# Patient Record
Sex: Female | Born: 1975
Health system: Southern US, Community
[De-identification: ages and names within clinical notes are randomized; demographics above are authoritative.]

## PROBLEM LIST (undated history)

## (undated) DIAGNOSIS — A609 Anogenital herpesviral infection, unspecified: Secondary | ICD-10-CM

## (undated) DIAGNOSIS — F419 Anxiety disorder, unspecified: Secondary | ICD-10-CM

## (undated) DIAGNOSIS — J45909 Unspecified asthma, uncomplicated: Secondary | ICD-10-CM

## (undated) HISTORY — PX: WISDOM TOOTH EXTRACTION: SHX21

## (undated) HISTORY — DX: Anxiety disorder, unspecified: F41.9

## (undated) HISTORY — DX: Unspecified asthma, uncomplicated: J45.909

## (undated) HISTORY — DX: Anogenital herpesviral infection, unspecified: A60.9

---

## 1999-07-20 ENCOUNTER — Other Ambulatory Visit: Admission: RE | Admit: 1999-07-20 | Discharge: 1999-07-20 | Payer: Self-pay | Admitting: Obstetrics and Gynecology

## 2000-09-17 ENCOUNTER — Other Ambulatory Visit: Admission: RE | Admit: 2000-09-17 | Discharge: 2000-09-17 | Payer: Self-pay | Admitting: Obstetrics and Gynecology

## 2001-12-31 ENCOUNTER — Other Ambulatory Visit: Admission: RE | Admit: 2001-12-31 | Discharge: 2001-12-31 | Payer: Self-pay | Admitting: Obstetrics and Gynecology

## 2003-01-20 ENCOUNTER — Other Ambulatory Visit: Admission: RE | Admit: 2003-01-20 | Discharge: 2003-01-20 | Payer: Self-pay | Admitting: Obstetrics and Gynecology

## 2004-02-14 ENCOUNTER — Other Ambulatory Visit: Admission: RE | Admit: 2004-02-14 | Discharge: 2004-02-14 | Payer: Self-pay | Admitting: Obstetrics and Gynecology

## 2005-03-19 ENCOUNTER — Other Ambulatory Visit: Admission: RE | Admit: 2005-03-19 | Discharge: 2005-03-19 | Payer: Self-pay | Admitting: Obstetrics and Gynecology

## 2005-10-08 ENCOUNTER — Inpatient Hospital Stay (HOSPITAL_COMMUNITY): Admission: AD | Admit: 2005-10-08 | Discharge: 2005-10-10 | Payer: Self-pay | Admitting: Obstetrics and Gynecology

## 2006-03-26 ENCOUNTER — Other Ambulatory Visit: Admission: RE | Admit: 2006-03-26 | Discharge: 2006-03-26 | Payer: Self-pay | Admitting: Obstetrics and Gynecology

## 2006-07-28 ENCOUNTER — Emergency Department (HOSPITAL_COMMUNITY): Admission: EM | Admit: 2006-07-28 | Discharge: 2006-07-28 | Payer: Self-pay | Admitting: Emergency Medicine

## 2012-03-01 LAB — HM PAP SMEAR: HM PAP: NORMAL

## 2012-05-14 LAB — OB RESULTS CONSOLE ABO/RH: RH Type: POSITIVE

## 2012-05-14 LAB — OB RESULTS CONSOLE RUBELLA ANTIBODY, IGM: Rubella: IMMUNE

## 2012-05-14 LAB — OB RESULTS CONSOLE RPR: RPR: NONREACTIVE

## 2012-05-14 LAB — OB RESULTS CONSOLE HEPATITIS B SURFACE ANTIGEN: Hepatitis B Surface Ag: NEGATIVE

## 2012-05-14 LAB — OB RESULTS CONSOLE HIV ANTIBODY (ROUTINE TESTING): HIV: NONREACTIVE

## 2012-10-29 NOTE — L&D Delivery Note (Signed)
Delivery Note Pt progressed quickly to complete and pushed well.  At 10:26 PM a viable female was delivered via Vaginal, Spontaneous Delivery (Presentation: Left Occiput Anterior).  APGAR: 8, 9; weight pending.   Placenta status: Intact, Spontaneous.  Cord: 3 vessels with the following complications: None.   Anesthesia: Local  Episiotomy: Median, done due to minimal anesthesia and severe pain due to rapid progress Lacerations:  None  Suture Repair: 3.0 vicryl rapide Est. Blood Loss (mL): 400  Mom to postpartum.  Baby to stay with mom.  Shiane Wenberg D 12/01/2012, 11:21 PM

## 2012-11-26 ENCOUNTER — Telehealth (HOSPITAL_COMMUNITY): Payer: Self-pay | Admitting: *Deleted

## 2012-11-26 ENCOUNTER — Encounter (HOSPITAL_COMMUNITY): Payer: Self-pay | Admitting: *Deleted

## 2012-11-26 NOTE — Telephone Encounter (Signed)
Preadmission screen  

## 2012-12-01 ENCOUNTER — Encounter (HOSPITAL_COMMUNITY): Payer: Self-pay | Admitting: *Deleted

## 2012-12-01 ENCOUNTER — Inpatient Hospital Stay (HOSPITAL_COMMUNITY)
Admission: AD | Admit: 2012-12-01 | Discharge: 2012-12-03 | DRG: 372 | Disposition: A | Discharge status: Home / Self Care | Payer: BC Managed Care – PPO | Source: Ambulatory Visit | Attending: Obstetrics and Gynecology | Admitting: Obstetrics and Gynecology

## 2012-12-01 ENCOUNTER — Encounter (HOSPITAL_COMMUNITY): Payer: Self-pay | Admitting: Anesthesiology

## 2012-12-01 ENCOUNTER — Inpatient Hospital Stay (HOSPITAL_COMMUNITY): Payer: BC Managed Care – PPO | Admitting: Anesthesiology

## 2012-12-01 DIAGNOSIS — IMO0001 Reserved for inherently not codable concepts without codable children: Secondary | ICD-10-CM

## 2012-12-01 DIAGNOSIS — A6000 Herpesviral infection of urogenital system, unspecified: Secondary | ICD-10-CM | POA: Diagnosis present

## 2012-12-01 DIAGNOSIS — O98519 Other viral diseases complicating pregnancy, unspecified trimester: Principal | ICD-10-CM | POA: Diagnosis present

## 2012-12-01 LAB — CBC
Hemoglobin: 12.3 g/dL (ref 12.0–15.0)
Platelets: 194 10*3/uL (ref 150–400)
RBC: 3.93 MIL/uL (ref 3.87–5.11)

## 2012-12-01 MED ORDER — FENTANYL 2.5 MCG/ML BUPIVACAINE 1/10 % EPIDURAL INFUSION (WH - ANES)
14.0000 mL/h | INTRAMUSCULAR | Status: DC
  Filled 2012-12-01: qty 125

## 2012-12-01 MED ORDER — ONDANSETRON HCL 4 MG/2ML IJ SOLN: 4.0000 mg | Freq: Four times a day (QID) | INTRAMUSCULAR | Status: DC | PRN

## 2012-12-01 MED ORDER — BUTORPHANOL TARTRATE 1 MG/ML IJ SOLN
1.0000 mg | Freq: Once | INTRAMUSCULAR | Status: AC
  Administered 2012-12-01: 1 mg via INTRAVENOUS
  Filled 2012-12-01: qty 1

## 2012-12-01 MED ORDER — CITRIC ACID-SODIUM CITRATE 334-500 MG/5ML PO SOLN: 30.0000 mL | ORAL | Status: DC | PRN

## 2012-12-01 MED ORDER — PHENYLEPHRINE 40 MCG/ML (10ML) SYRINGE FOR IV PUSH (FOR BLOOD PRESSURE SUPPORT)
80.0000 ug | PREFILLED_SYRINGE | INTRAVENOUS | Status: DC | PRN
  Filled 2012-12-01: qty 5

## 2012-12-01 MED ORDER — OXYTOCIN 40 UNITS IN LACTATED RINGERS INFUSION - SIMPLE MED
62.5000 mL/h | INTRAVENOUS | Status: DC
  Administered 2012-12-01: 62.5 mL/h via INTRAVENOUS
  Filled 2012-12-01: qty 1000

## 2012-12-01 MED ORDER — LACTATED RINGERS IV SOLN
INTRAVENOUS | Status: DC
  Administered 2012-12-01: 23:00:00 via INTRAVENOUS

## 2012-12-01 MED ORDER — IBUPROFEN 600 MG PO TABS
600.0000 mg | ORAL_TABLET | Freq: Four times a day (QID) | ORAL | Status: DC | PRN
  Administered 2012-12-01: 600 mg via ORAL
  Filled 2012-12-01: qty 1

## 2012-12-01 MED ORDER — LACTATED RINGERS IV SOLN: 500.0000 mL | INTRAVENOUS | Status: DC | PRN

## 2012-12-01 MED ORDER — ACETAMINOPHEN 325 MG PO TABS: 650.0000 mg | ORAL_TABLET | ORAL | Status: DC | PRN

## 2012-12-01 MED ORDER — NALOXONE HCL 0.4 MG/ML IJ SOLN
INTRAMUSCULAR | Status: AC
  Filled 2012-12-01: qty 1

## 2012-12-01 MED ORDER — EPHEDRINE 5 MG/ML INJ
10.0000 mg | INTRAVENOUS | Status: DC | PRN
  Filled 2012-12-01: qty 4

## 2012-12-01 MED ORDER — DIPHENHYDRAMINE HCL 50 MG/ML IJ SOLN: 12.5000 mg | INTRAMUSCULAR | Status: DC | PRN

## 2012-12-01 MED ORDER — LACTATED RINGERS IV SOLN: 500.0000 mL | Freq: Once | INTRAVENOUS | Status: DC

## 2012-12-01 MED ORDER — EPHEDRINE 5 MG/ML INJ: 10.0000 mg | INTRAVENOUS | Status: DC | PRN

## 2012-12-01 MED ORDER — PHENYLEPHRINE 40 MCG/ML (10ML) SYRINGE FOR IV PUSH (FOR BLOOD PRESSURE SUPPORT): 80.0000 ug | PREFILLED_SYRINGE | INTRAVENOUS | Status: DC | PRN

## 2012-12-01 MED ORDER — OXYCODONE-ACETAMINOPHEN 5-325 MG PO TABS: 1.0000 | ORAL_TABLET | ORAL | Status: DC | PRN

## 2012-12-01 MED ORDER — LIDOCAINE HCL (PF) 1 % IJ SOLN
30.0000 mL | INTRAMUSCULAR | Status: DC | PRN
  Administered 2012-12-01: 30 mL via SUBCUTANEOUS
  Filled 2012-12-01: qty 30

## 2012-12-01 MED ORDER — OXYTOCIN BOLUS FROM INFUSION
500.0000 mL | INTRAVENOUS | Status: DC
  Administered 2012-12-01: 500 mL via INTRAVENOUS

## 2012-12-01 NOTE — Treatment Plan (Signed)
Report to Wilkie Aye , Forensic scientist.  Patient may go to 71

## 2012-12-01 NOTE — MAU Note (Signed)
Seen in office today.  Contractions got stronger around 7 pm.  No leaking any fluid

## 2012-12-01 NOTE — H&P (Signed)
Cassandra Gross is a 37 y.o. female, G3 P1011, EGA 38+ weeks with EDC 2-14 presenting for evaluation of ctx.  In MAU, reg ctx, VE 3 cm dilated.  While waiting to see if she changed, ctx became more painful.  She was admitted, had SROM and rapidly progressed to complete.  Prenatal care complicated by h/o HSV, on Valtrex suppression for the past few weeks without symptoms or lesions.  See prenatal records for complete history.  Maternal Medical History:  Reason for admission: Reason for admission: contractions.  Contractions: Frequency: regular.   Perceived severity is strong.    Fetal activity: Perceived fetal activity is normal.    Prenatal complications: no prenatal complications   OB History    Grav Para Term Preterm Abortions TAB SAB Ect Mult Living   2 1 1  1  1   1     SVD at 40 weeks, 7 lbs 2 oz, vacuum assist SAB  Past Medical History  Diagnosis Date  . Asthma   . HSV (herpes simplex virus) anogenital infection    Past Surgical History  Procedure Date  . Wisdom tooth extraction    Family History: family history includes Asthma in her maternal grandfather and mother; Hyperlipidemia in her father; and Hypertension in her father. Social History:  reports that she has never smoked. She has never used smokeless tobacco. She reports that she does not drink alcohol or use illicit drugs.   Prenatal Transfer Tool  Maternal Diabetes: No Genetic Screening: Declined Maternal Ultrasounds/Referrals: Normal Fetal Ultrasounds or other Referrals:  None Maternal Substance Abuse:  No Significant Maternal Medications:  Meds include: Other: Valtrex Significant Maternal Lab Results:  Lab values include: Group B Strep negative Other Comments:  h/o HSV  Review of Systems  Respiratory: Negative.   Cardiovascular: Negative.     Dilation: 10 Effacement (%): 100 Station: +2 Exam by:: [email or web address]-[email or web address], rnc Blood pressure 114/61, pulse 98, temperature 97.3 F (36.3 C), temperature source  Oral, resp. rate 22, height 5\' 3"  (1.6 m), weight 69.4 kg (153 lb), last menstrual period 03/07/2012, unknown if currently breastfeeding. Maternal Exam:  Uterine Assessment: Contraction strength is firm.  Contraction frequency is regular.   Abdomen: Patient reports no abdominal tenderness. Estimated fetal weight is 7 lbs.   Fetal presentation: vertex  Introitus: Normal vulva. Normal vagina.  Pelvis: adequate for delivery.   Cervix: Cervix evaluated by digital exam.     Physical Exam  Constitutional: She appears well-developed and well-nourished.  Cardiovascular: Normal rate and normal heart sounds.   No murmur heard. Respiratory: Effort normal. No respiratory distress. She has no wheezes.  GI: Soft.       gravid    Prenatal labs: ABO, Rh: O/Positive/-- (07/17 0000) Antibody: Negative (07/17 0000) Rubella: Immune, Nonimmune (07/17 0000) RPR: Nonreactive (07/17 0000)  HBsAg: Negative (07/17 0000)  HIV: Non-reactive (07/17 0000)  GBS: Negative (01/22 0000)  GCT:  159 GTT nl  Assessment/Plan: IUP at 38+ weeks in active labor, see delivery note.   Deaken Jurgens D 12/01/2012, 11:15 PM

## 2012-12-01 NOTE — Treatment Plan (Signed)
Call to Dr Jackelyn Knife. Pt may be admitted to Labor and delivery

## 2012-12-01 NOTE — Anesthesia Preprocedure Evaluation (Deleted)
Anesthesia Evaluation  Patient identified by MRN, date of birth, ID band Patient awake    Reviewed: Allergy & Precautions, H&P , Patient's Chart, lab work & pertinent test results  Airway Mallampati: II TM Distance: >3 FB Neck ROM: full    Dental No notable dental hx.    Pulmonary neg pulmonary ROS, asthma ,  breath sounds clear to auscultation  Pulmonary exam normal       Cardiovascular negative cardio ROS  Rhythm:regular Rate:Normal     Neuro/Psych negative neurological ROS  negative psych ROS   GI/Hepatic negative GI ROS, Neg liver ROS,   Endo/Other  negative endocrine ROS  Renal/GU negative Renal ROS     Musculoskeletal   Abdominal   Peds  Hematology negative hematology ROS (+)   Anesthesia Other Findings Asthma     HSV (herpes simplex virus) anogenital infection   Reproductive/Obstetrics (+) Pregnancy                           Anesthesia Physical Anesthesia Plan  ASA: II  Anesthesia Plan: Epidural   Post-op Pain Management:    Induction:   Airway Management Planned:   Additional Equipment:   Intra-op Plan:   Post-operative Plan:   Informed Consent: I have reviewed the patients History and Physical, chart, labs and discussed the procedure including the risks, benefits and alternatives for the proposed anesthesia with the patient or authorized representative who has indicated his/her understanding and acceptance.     Plan Discussed with:   Anesthesia Plan Comments:         Anesthesia Quick Evaluation

## 2012-12-02 ENCOUNTER — Encounter (HOSPITAL_COMMUNITY): Payer: Self-pay | Admitting: *Deleted

## 2012-12-02 LAB — TYPE AND SCREEN

## 2012-12-02 LAB — RPR: RPR Ser Ql: NONREACTIVE

## 2012-12-02 MED ORDER — MEASLES, MUMPS & RUBELLA VAC ~~LOC~~ INJ
0.5000 mL | INJECTION | Freq: Once | SUBCUTANEOUS | Status: DC
  Filled 2012-12-02: qty 0.5

## 2012-12-02 MED ORDER — WITCH HAZEL-GLYCERIN EX PADS: 1.0000 "application " | MEDICATED_PAD | CUTANEOUS | Status: DC | PRN

## 2012-12-02 MED ORDER — ZOLPIDEM TARTRATE 5 MG PO TABS
5.0000 mg | ORAL_TABLET | Freq: Every evening | ORAL | Status: DC | PRN
Start: 2012-12-02 — End: 2012-12-03

## 2012-12-02 MED ORDER — SENNOSIDES-DOCUSATE SODIUM 8.6-50 MG PO TABS
2.0000 | ORAL_TABLET | Freq: Every day | ORAL | Status: DC
  Administered 2012-12-02: 2 via ORAL

## 2012-12-02 MED ORDER — DIBUCAINE 1 % RE OINT: 1.0000 "application " | TOPICAL_OINTMENT | RECTAL | Status: DC | PRN

## 2012-12-02 MED ORDER — SIMETHICONE 80 MG PO CHEW: 80.0000 mg | CHEWABLE_TABLET | ORAL | Status: DC | PRN

## 2012-12-02 MED ORDER — METHYLERGONOVINE MALEATE 0.2 MG/ML IJ SOLN: 0.2000 mg | INTRAMUSCULAR | Status: DC | PRN

## 2012-12-02 MED ORDER — ONDANSETRON HCL 4 MG/2ML IJ SOLN: 4.0000 mg | INTRAMUSCULAR | Status: DC | PRN

## 2012-12-02 MED ORDER — TETANUS-DIPHTH-ACELL PERTUSSIS 5-2.5-18.5 LF-MCG/0.5 IM SUSP: 0.5000 mL | Freq: Once | INTRAMUSCULAR | Status: DC

## 2012-12-02 MED ORDER — IBUPROFEN 600 MG PO TABS
600.0000 mg | ORAL_TABLET | Freq: Four times a day (QID) | ORAL | Status: DC
  Administered 2012-12-02 – 2012-12-03 (×5): 600 mg via ORAL
  Filled 2012-12-02 (×5): qty 1

## 2012-12-02 MED ORDER — METHYLERGONOVINE MALEATE 0.2 MG PO TABS: 0.2000 mg | ORAL_TABLET | ORAL | Status: DC | PRN

## 2012-12-02 MED ORDER — DIPHENHYDRAMINE HCL 25 MG PO CAPS: 25.0000 mg | ORAL_CAPSULE | Freq: Four times a day (QID) | ORAL | Status: DC | PRN

## 2012-12-02 MED ORDER — ONDANSETRON HCL 4 MG PO TABS: 4.0000 mg | ORAL_TABLET | ORAL | Status: DC | PRN

## 2012-12-02 MED ORDER — BENZOCAINE-MENTHOL 20-0.5 % EX AERO
1.0000 "application " | INHALATION_SPRAY | CUTANEOUS | Status: DC | PRN
  Administered 2012-12-02: 1 via TOPICAL
  Filled 2012-12-02: qty 56

## 2012-12-02 MED ORDER — OXYCODONE-ACETAMINOPHEN 5-325 MG PO TABS: 1.0000 | ORAL_TABLET | ORAL | Status: DC | PRN

## 2012-12-02 MED ORDER — MAGNESIUM HYDROXIDE 400 MG/5ML PO SUSP: 30.0000 mL | ORAL | Status: DC | PRN

## 2012-12-02 MED ORDER — PRENATAL MULTIVITAMIN CH
1.0000 | ORAL_TABLET | Freq: Every day | ORAL | Status: DC
  Administered 2012-12-02: 1 via ORAL
  Filled 2012-12-02: qty 1

## 2012-12-02 MED ORDER — LANOLIN HYDROUS EX OINT: TOPICAL_OINTMENT | CUTANEOUS | Status: DC | PRN

## 2012-12-02 NOTE — Progress Notes (Signed)
PPD #1 No problems Afeb, VSS Fundus firm, NT at U-1 Continue routine postpartum care 

## 2012-12-03 NOTE — Discharge Summary (Signed)
Obstetric Discharge Summary Reason for Admission: onset of labor Prenatal Procedures: none Intrapartum Procedures: spontaneous vaginal delivery Postpartum Procedures: none Complications-Operative and Postpartum: 2nd degree perineal laceration Hemoglobin  Date Value Range Status  12/01/2012 12.3  12.0 - 15.0 g/dL Final     HCT  Date Value Range Status  12/01/2012 36.5  36.0 - 46.0 % Final    Physical Exam:  General: alert Lochia: appropriate Uterine Fundus: firm  Discharge Diagnoses: Term Pregnancy-delivered  Discharge Information: Date: 12/03/2012 Activity: pelvic rest Diet: routine Medications: Ibuprofen Condition: stable Instructions: refer to practice specific booklet Discharge to: home Follow-up Information    Follow up with Maidie Streight D, MD. Schedule an appointment as soon as possible for a visit in 6 weeks.   Contact information:   [address] [place] [address] [phone]          Newborn Data: Live born female  Birth Weight: 6 lb 5.8 oz (2886 g) APGAR: 8, 9  Home with mother.  Garlon Tuggle D 12/03/2012, 10:13 AM

## 2012-12-03 NOTE — Progress Notes (Signed)
PPD #2 No problems Afeb, VSS D/c home 

## 2012-12-05 ENCOUNTER — Inpatient Hospital Stay (HOSPITAL_COMMUNITY): Admission: RE | Admit: 2012-12-05 | Payer: BC Managed Care – PPO | Source: Ambulatory Visit

## 2014-02-26 ENCOUNTER — Telehealth: Payer: Self-pay

## 2014-02-26 NOTE — Telephone Encounter (Signed)
Left message for call back Non-identifiable   Pap Flu- 08/13/12 Td- 09/10/12

## 2014-03-01 ENCOUNTER — Encounter: Payer: Self-pay | Admitting: Family Medicine

## 2014-03-01 ENCOUNTER — Ambulatory Visit (INDEPENDENT_AMBULATORY_CARE_PROVIDER_SITE_OTHER): Payer: BC Managed Care – PPO | Admitting: Family Medicine

## 2014-03-01 VITALS — BP 102/74 | HR 81 | Temp 98.5°F | Resp 16 | Ht 63.0 in | Wt 139.5 lb

## 2014-03-01 DIAGNOSIS — J45909 Unspecified asthma, uncomplicated: Secondary | ICD-10-CM

## 2014-03-01 DIAGNOSIS — Z0000 Encounter for general adult medical examination without abnormal findings: Secondary | ICD-10-CM

## 2014-03-01 DIAGNOSIS — J4530 Mild persistent asthma, uncomplicated: Secondary | ICD-10-CM | POA: Insufficient documentation

## 2014-03-01 NOTE — Progress Notes (Signed)
Pre visit review using our clinic review tool, if applicable. No additional management support is needed unless otherwise documented below in the visit note. 

## 2014-03-01 NOTE — Patient Instructions (Signed)
Follow up in 1 year or as needed We'll notify you of your lab results and make any changes if needed Keep up the good work!  You look great! Welcome!  We're glad to have you!!! 

## 2014-03-01 NOTE — Progress Notes (Signed)
   Subjective:    Patient ID: Cassandra Gross, female    DOB: 06-07-1976, 38 y.o.   MRN: [id number]  HPI New to establish.  No previous PCP.  UTD on pap (Dr Meissinger).  Asthma- chronic problem, will take Symbicort in the Spring and Fall when allergies flare.  Otherwise will use albuterol as needed.   Review of Systems Patient reports no vision/ hearing changes, adenopathy,fever, weight change,  persistant/recurrent hoarseness , swallowing issues, chest pain, palpitations, edema, persistant/recurrent cough, hemoptysis, dyspnea (rest/exertional/paroxysmal nocturnal), gastrointestinal bleeding (melena, rectal bleeding), abdominal pain, significant heartburn, bowel changes, GU symptoms (dysuria, hematuria, incontinence), Gyn symptoms (abnormal  bleeding, pain),  syncope, focal weakness, memory loss, numbness & tingling, skin/hair/nail changes, abnormal bruising or bleeding, anxiety, or depression.     Objective:   Physical Exam General Appearance:    Alert, cooperative, no distress, appears stated age  Head:    Normocephalic, without obvious abnormality, atraumatic  Eyes:    PERRL, conjunctiva/corneas clear, EOM's intact, fundi    benign, both eyes  Ears:    Normal TM's and external ear canals, both ears  Nose:   Nares normal, septum midline, mucosa normal, no drainage    or sinus tenderness  Throat:   Lips, mucosa, and tongue normal; teeth and gums normal  Neck:   Supple, symmetrical, trachea midline, no adenopathy;    Thyroid: no enlargement/tenderness/nodules  Back:     Symmetric, no curvature, ROM normal, no CVA tenderness  Lungs:     Clear to auscultation bilaterally, respirations unlabored  Chest Wall:    No tenderness or deformity   Heart:    Regular rate and rhythm, S1 and S2 normal, no murmur, rub   or gallop  Breast Exam:    Deferred to GYN  Abdomen:     Soft, non-tender, bowel sounds active all four quadrants,    no masses, no organomegaly  Genitalia:    Deferred to GYN    Rectal:    Extremities:   Extremities normal, atraumatic, no cyanosis or edema  Pulses:   2+ and symmetric all extremities  Skin:   Skin color, texture, turgor normal, no rashes or lesions  Lymph nodes:   Cervical, supraclavicular, and axillary nodes normal  Neurologic:   CNII-XII intact, normal strength, sensation and reflexes    throughout          Assessment & Plan:

## 2014-03-01 NOTE — Assessment & Plan Note (Signed)
Pt's PE WNL.  UTD on GYN.  Check labs.  Anticipatory guidance provided.  

## 2014-03-01 NOTE — Assessment & Plan Note (Signed)
New to provider, well controlled per pt report.  Will refill meds prn.

## 2014-03-02 ENCOUNTER — Encounter: Payer: Self-pay | Admitting: General Practice

## 2014-03-02 LAB — CBC WITH DIFFERENTIAL/PLATELET
BASOS ABS: 0 10*3/uL (ref 0.0–0.1)
Basophils Relative: 0.4 % (ref 0.0–3.0)
EOS ABS: 0.4 10*3/uL (ref 0.0–0.7)
Eosinophils Relative: 6.1 % — ABNORMAL HIGH (ref 0.0–5.0)
HCT: 38.8 % (ref 36.0–46.0)
Hemoglobin: 13 g/dL (ref 12.0–15.0)
LYMPHS PCT: 28 % (ref 12.0–46.0)
Lymphs Abs: 1.9 10*3/uL (ref 0.7–4.0)
MCHC: 33.4 g/dL (ref 30.0–36.0)
MCV: 95.9 fl (ref 78.0–100.0)
Monocytes Absolute: 0.6 10*3/uL (ref 0.1–1.0)
Monocytes Relative: 8.6 % (ref 3.0–12.0)
NEUTROS ABS: 3.8 10*3/uL (ref 1.4–7.7)
NEUTROS PCT: 56.9 % (ref 43.0–77.0)
Platelets: 248 10*3/uL (ref 150.0–400.0)
RBC: 4.04 Mil/uL (ref 3.87–5.11)
RDW: 13.8 % (ref 11.5–15.5)
WBC: 6.7 10*3/uL (ref 4.0–10.5)

## 2014-03-02 LAB — BASIC METABOLIC PANEL
BUN: 11 mg/dL (ref 6–23)
CO2: 25 meq/L (ref 19–32)
Calcium: 8.9 mg/dL (ref 8.4–10.5)
Chloride: 105 mEq/L (ref 96–112)
Creatinine, Ser: 0.6 mg/dL (ref 0.4–1.2)
GFR: 112.01 mL/min (ref >60.00)
GLUCOSE: 86 mg/dL (ref 70–99)
POTASSIUM: 3.4 meq/L — AB (ref 3.5–5.1)
SODIUM: 139 meq/L (ref 135–145)

## 2014-03-02 LAB — LIPID PANEL
Cholesterol: 157 mg/dL (ref 0–200)
HDL: 55.4 mg/dL (ref >39.00)
LDL Cholesterol: 89 mg/dL (ref 0–99)
Total CHOL/HDL Ratio: 3
Triglycerides: 64 mg/dL (ref 0.0–149.0)
VLDL: 12.8 mg/dL (ref 0.0–40.0)

## 2014-03-02 LAB — HEPATIC FUNCTION PANEL
ALK PHOS: 60 U/L (ref 39–117)
ALT: 21 U/L (ref 0–35)
AST: 23 U/L (ref 0–37)
Albumin: 4.2 g/dL (ref 3.5–5.2)
BILIRUBIN DIRECT: 0 mg/dL (ref 0.0–0.3)
TOTAL PROTEIN: 6.9 g/dL (ref 6.0–8.3)
Total Bilirubin: 0.1 mg/dL — ABNORMAL LOW (ref 0.2–1.2)

## 2014-03-02 LAB — TSH: TSH: 1.31 u[IU]/mL (ref 0.35–4.50)

## 2014-03-07 LAB — VITAMIN D 1,25 DIHYDROXY
VITAMIN D3 1, 25 (OH): 64 pg/mL
Vitamin D 1, 25 (OH)2 Total: 64 pg/mL (ref 18–72)

## 2014-03-08 ENCOUNTER — Encounter: Payer: Self-pay | Admitting: General Practice

## 2014-03-08 NOTE — Telephone Encounter (Signed)
Unable to reach patient pre visit.  

## 2014-08-12 ENCOUNTER — Other Ambulatory Visit: Payer: Self-pay | Admitting: General Practice

## 2014-08-12 ENCOUNTER — Encounter: Payer: Self-pay | Admitting: Family Medicine

## 2014-08-12 ENCOUNTER — Ambulatory Visit (INDEPENDENT_AMBULATORY_CARE_PROVIDER_SITE_OTHER): Payer: BC Managed Care – PPO | Admitting: Family Medicine

## 2014-08-12 VITALS — BP 106/70 | HR 86 | Temp 98.3°F | Resp 16 | Wt 133.5 lb

## 2014-08-12 DIAGNOSIS — F419 Anxiety disorder, unspecified: Principal | ICD-10-CM

## 2014-08-12 DIAGNOSIS — F418 Other specified anxiety disorders: Secondary | ICD-10-CM

## 2014-08-12 DIAGNOSIS — F32A Depression, unspecified: Secondary | ICD-10-CM

## 2014-08-12 DIAGNOSIS — F329 Major depressive disorder, single episode, unspecified: Secondary | ICD-10-CM

## 2014-08-12 DIAGNOSIS — Z23 Encounter for immunization: Secondary | ICD-10-CM

## 2014-08-12 MED ORDER — FLUOXETINE HCL 20 MG PO TABS
20.0000 mg | ORAL_TABLET | Freq: Every day | ORAL | Status: DC
Start: 2014-08-12 — End: 2014-08-12

## 2014-08-12 MED ORDER — ALPRAZOLAM 0.5 MG PO TABS: 0.5000 mg | ORAL_TABLET | Freq: Every evening | ORAL | Status: DC | PRN

## 2014-08-12 MED ORDER — FLUOXETINE HCL 20 MG PO TABS: 20.0000 mg | ORAL_TABLET | Freq: Every day | ORAL | Status: DC

## 2014-08-12 NOTE — Assessment & Plan Note (Signed)
New.  Pt's sxs and intermittent somatic complaints are consistent w/ depression/anxiety.  Start daily controller medication w/ xanax as needed for breakthrough anxiety.  Reviewed supportive care and red flags that should prompt return.  Pt expressed understanding and is in agreement w/ plan.

## 2014-08-12 NOTE — Progress Notes (Signed)
   Subjective:    Patient ID: Cassandra Gross, female    DOB: [birth year], 38 y.o.   MRN: [id number]  HPI Anxiety- pt reports increased stress over the last months.  'multiple events that have been emotionally difficult'.  Eye twitching x3 days.  Difficulty sleeping.  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  Inability to take deep breaths at times.  Some chest pressure.  sxs are intermittent- worse w/ stress.  Increased tearfulness.  Increased irritability.  No family hx of depression/anxiety.   Review of Systems For ROS see HPI     Objective:   Physical Exam  Vitals reviewed. Constitutional: She is oriented to person, place, and time. She appears well-developed and well-nourished. No distress.  HENT:  Head: Normocephalic and atraumatic.  Neck: Normal range of motion. Neck supple. No thyromegaly present.  Cardiovascular: Normal rate, regular rhythm, normal heart sounds and intact distal pulses.   Pulmonary/Chest: Effort normal and breath sounds normal. No respiratory distress. She has no wheezes. She has no rales.  Lymphadenopathy:    She has no cervical adenopathy.  Neurological: She is alert and oriented to person, place, and time.  Skin: Skin is warm and dry.  Psychiatric: Her behavior is normal. Thought content normal.  tearful          Assessment & Plan:

## 2014-08-12 NOTE — Patient Instructions (Signed)
Follow up in 4-6 weeks to recheck anxiety Start the Fluoxetine daily for anxiety Use the Alprazolam only as needed for those panicked moments Try and find a stress outlet Call with any questions or concerns Hang in there!!!

## 2014-08-12 NOTE — Progress Notes (Signed)
Pre visit review using our clinic review tool, if applicable. No additional management support is needed unless otherwise documented below in the visit note. 

## 2014-08-30 ENCOUNTER — Encounter: Payer: Self-pay | Admitting: Family Medicine

## 2014-09-07 ENCOUNTER — Telehealth: Payer: Self-pay

## 2014-09-07 NOTE — Telephone Encounter (Signed)
Pt would like her prozac [facility]anax [facility] [address]

## 2014-09-08 NOTE — Telephone Encounter (Signed)
Called pt and advised [facility] to an [facility] being available at the [facility].

## 2014-09-22 ENCOUNTER — Encounter: Payer: Self-pay | Admitting: Family Medicine

## 2014-09-22 ENCOUNTER — Ambulatory Visit (INDEPENDENT_AMBULATORY_CARE_PROVIDER_SITE_OTHER): Payer: BC Managed Care – PPO | Admitting: Family Medicine

## 2014-09-22 VITALS — BP 110/80 | HR 92 | Temp 98.3°F | Resp 16 | Wt 134.5 lb

## 2014-09-22 DIAGNOSIS — F32A Depression, unspecified: Secondary | ICD-10-CM

## 2014-09-22 DIAGNOSIS — F329 Major depressive disorder, single episode, unspecified: Secondary | ICD-10-CM

## 2014-09-22 DIAGNOSIS — F418 Other specified anxiety disorders: Secondary | ICD-10-CM

## 2014-09-22 DIAGNOSIS — F419 Anxiety disorder, unspecified: Principal | ICD-10-CM

## 2014-09-22 NOTE — Progress Notes (Signed)
Pre visit review using our clinic review tool, if applicable. No additional management support is needed unless otherwise documented below in the visit note. 

## 2014-09-22 NOTE — Progress Notes (Signed)
   Subjective:    Patient ID: Cassandra Gross, female    DOB: Mar 30, 1976, 38 y.o.   MRN: [id number]  HPI Depression- pt reports feeling better since starting Prozac.  Less anxious, less short, more patience.  'able to let go of things that I previously could not'.  Feels current dose is correct and no need to go up.  Denies palpitations, HA, sleep changes, N/V/D.   Review of Systems For ROS see HPI     Objective:   Physical Exam  Constitutional: She is oriented to person, place, and time. She appears well-developed and well-nourished. No distress.  Neurological: She is alert and oriented to person, place, and time.  Skin: Skin is warm and dry.  Psychiatric: She has a normal mood and affect. Her behavior is normal. Thought content normal.  Vitals reviewed.         Assessment & Plan:

## 2014-09-22 NOTE — Assessment & Plan Note (Signed)
Improved since starting meds.  Feels dose is appropriate and not interested in making changes at this time.  Will continue to follow.

## 2014-09-22 NOTE — Patient Instructions (Signed)
Schedule your complete physical in 6 months No med changes at this time- if things change, call me!!! Call with any questions or concerns HAPPY HOLIDAYS!!!

## 2014-10-13 ENCOUNTER — Ambulatory Visit (INDEPENDENT_AMBULATORY_CARE_PROVIDER_SITE_OTHER): Payer: BC Managed Care – PPO | Admitting: Family Medicine

## 2014-10-13 ENCOUNTER — Encounter: Payer: Self-pay | Admitting: Family Medicine

## 2014-10-13 VITALS — BP 110/74 | HR 78 | Temp 98.4°F | Resp 16 | Wt 132.4 lb

## 2014-10-13 DIAGNOSIS — H65192 Other acute nonsuppurative otitis media, left ear: Secondary | ICD-10-CM

## 2014-10-13 DIAGNOSIS — H6590 Unspecified nonsuppurative otitis media, unspecified ear: Secondary | ICD-10-CM | POA: Insufficient documentation

## 2014-10-13 MED ORDER — PROMETHAZINE-DM 6.25-15 MG/5ML PO SYRP: 5.0000 mL | ORAL_SOLUTION | Freq: Four times a day (QID) | ORAL | Status: DC | PRN

## 2014-10-13 MED ORDER — AZITHROMYCIN 250 MG PO TABS
ORAL_TABLET | ORAL | Status: DC
Start: 2014-10-13 — End: 2014-11-16

## 2014-10-13 NOTE — Patient Instructions (Signed)
Follow up as needed Start the Zpack as directed Drink plenty of fluids REST! Use the cough syrup only as needed Call with any questions or concerns Hang in there! Happy Holidays!

## 2014-10-13 NOTE — Progress Notes (Signed)
   Subjective:    Patient ID: Cassandra Gross, female    DOB: [birth year], 38 y.o.   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  sxs started Sunday.  + nasal congestion, dry cough, bilateral ear pain, L>R, + facial pain/pressure.  + sore throat.  No nausea/vomiting/diarrhea.  No fevers.  + sick contacts.   Review of Systems For ROS see HPI     Objective:   Physical Exam  Constitutional: She is oriented to person, place, and time. She appears well-developed and well-nourished. No distress.  HENT:  Head: Normocephalic and atraumatic.  Nose: Nose normal.  Mouth/Throat: Oropharynx is clear and moist. No oropharyngeal exudate.  TMs dull, poor landmarks, erythematous, bulging (L>R) No TTP over sinuses  Neck: Normal range of motion.  Cardiovascular: Normal rate, regular rhythm, normal heart sounds and intact distal pulses.   Pulmonary/Chest: Effort normal and breath sounds normal. No respiratory distress. She has no wheezes. She has no rales.  Lymphadenopathy:    She has cervical adenopathy.  Neurological: She is alert and oriented to person, place, and time.  Skin: Skin is warm and dry.  Psychiatric: She has a normal mood and affect. Her behavior is normal.  Vitals reviewed.         Assessment & Plan:

## 2014-10-13 NOTE — Progress Notes (Signed)
Pre visit review using our clinic review tool, if applicable. No additional management support is needed unless otherwise documented below in the visit note. 

## 2014-10-17 NOTE — Assessment & Plan Note (Signed)
New.  Pt w/ bilateral OM (L>R).  Start abx.  Reviewed supportive care and red flags that should prompt return.  Pt expressed understanding and is in agreement w/ plan.

## 2014-11-16 ENCOUNTER — Ambulatory Visit (INDEPENDENT_AMBULATORY_CARE_PROVIDER_SITE_OTHER): Payer: BC Managed Care – PPO | Admitting: Internal Medicine

## 2014-11-16 ENCOUNTER — Encounter: Payer: Self-pay | Admitting: Internal Medicine

## 2014-11-16 VITALS — BP 108/73 | HR 88 | Temp 98.2°F | Wt 138.1 lb

## 2014-11-16 DIAGNOSIS — L089 Local infection of the skin and subcutaneous tissue, unspecified: Secondary | ICD-10-CM

## 2014-11-16 MED ORDER — DOXYCYCLINE HYCLATE 100 MG PO TABS: 100.0000 mg | ORAL_TABLET | Freq: Two times a day (BID) | ORAL | Status: DC

## 2014-11-16 NOTE — Progress Notes (Signed)
Pre visit review using our clinic review tool, if applicable. No additional management support is needed unless otherwise documented below in the visit note. 

## 2014-11-16 NOTE — Progress Notes (Signed)
   Subjective:    Patient ID: Cassandra Gross, female    DOB: 08-Apr-1976, 39 y.o.   MRN: [id number]  DOS:  11/16/2014 Type of visit - description : acute Interval history: Symptoms started 2 days ago with redness at the left leg. The area hurts some but she denies discharge. Her son was recently diagnosed with MRSA and is on antibiotics and getting better. Patient is concerned about infection with MRSA.  ROS Denies any fever or chills No other skin lesions Medications list reviewed, on birth  control w/ Implanon    Past Medical History  Diagnosis Date  . Asthma   . HSV (herpes simplex virus) anogenital infection   . Anxiety     Past Surgical History  Procedure Laterality Date  . Wisdom tooth extraction      History   Social History  . Marital Status: Married    Spouse Name: N/A    Number of Children: N/A  . Years of Education: N/A   Occupational History  . Not on file.   Social History Main Topics  . Smoking status: Never Smoker   . Smokeless tobacco: Never Used  . Alcohol Use: No  . Drug Use: No  . Sexual Activity: Yes   Other Topics Concern  . Not on file   Social History Narrative        Medication List       This list is accurate as of: 11/16/14  6:32 PM.  Always use your most recent med list.               albuterol 108 (90 BASE) MCG/ACT inhaler  Commonly known as:  PROVENTIL HFA;VENTOLIN HFA  Inhale 2 puffs into the lungs every 6 (six) hours as needed for wheezing or shortness of breath.     ALPRAZolam 0.5 MG tablet  Commonly known as:  XANAX  Take 1 tablet (0.5 mg total) by mouth at bedtime as needed for anxiety.     budesonide-formoterol 80-4.5 MCG/ACT inhaler  Commonly known as:  SYMBICORT  Inhale 2 puffs into the lungs 2 (two) times daily.     doxycycline 100 MG tablet  Commonly known as:  VIBRA-TABS  Take 1 tablet (100 mg total) by mouth 2 (two) times daily.     FLUoxetine 20 MG tablet  Commonly known as:  PROZAC  Take 1 tablet  (20 mg total) by mouth daily.     IMPLANON [place]  Inject into the skin.           Objective:   Physical Exam  Constitutional: She appears well-nourished. No distress.  Musculoskeletal:       Legs: Skin: She is not diaphoretic.  Psychiatric: She has a normal mood and affect. Her behavior is normal. Judgment and thought content normal.   BP 108/73 mmHg  Pulse 88  Temp(Src) 98.2 F (36.8 C) (Oral)  Wt 138 lb 2 oz (62.653 kg)  SpO2 96%       Assessment & Plan:   Skin infection, She has a small place with cellulitis, no abscess, recently exposed to documented MRSA. Plan: Start doxycycline, call if not better, see instructions

## 2014-11-16 NOTE — Patient Instructions (Signed)
Take the  antibiotics as prescribed Put a warm  compress twice a day Call if the area gets worse, you have fever or chills or if not gradually improving in the next few days

## 2014-12-28 ENCOUNTER — Other Ambulatory Visit: Payer: Self-pay | Admitting: Family Medicine

## 2014-12-28 MED ORDER — FLUOXETINE HCL 20 MG PO TABS: 20.0000 mg | ORAL_TABLET | Freq: Every day | ORAL | Status: DC

## 2014-12-28 MED ORDER — ALPRAZOLAM 0.5 MG PO TABS: 0.5000 mg | ORAL_TABLET | Freq: Every evening | ORAL | Status: DC | PRN

## 2014-12-28 NOTE — Telephone Encounter (Signed)
Caller name: leauna Relation to pt: self Call back number: [phone] [facility] [address]  Reason for call:   Requesting refill of generic of prozax and xanax.

## 2014-12-28 NOTE — Telephone Encounter (Signed)
Med filled and faxed.  

## 2014-12-28 NOTE — Telephone Encounter (Signed)
Last OV 09/2014 Alprazolam last filled 10-15 #30 with 1 prozac last filled 10-15 #30 with 3

## 2015-03-02 ENCOUNTER — Telehealth: Payer: Self-pay | Admitting: Family Medicine

## 2015-03-02 NOTE — Telephone Encounter (Signed)
Pre Visit letter sent  °

## 2015-03-22 ENCOUNTER — Telehealth: Payer: Self-pay

## 2015-03-22 NOTE — Telephone Encounter (Signed)
Pre visit call completed 

## 2015-03-23 ENCOUNTER — Ambulatory Visit (INDEPENDENT_AMBULATORY_CARE_PROVIDER_SITE_OTHER): Payer: BC Managed Care – PPO | Admitting: Family Medicine

## 2015-03-23 ENCOUNTER — Encounter: Payer: Self-pay | Admitting: Family Medicine

## 2015-03-23 VITALS — BP 106/76 | HR 83 | Temp 98.4°F | Resp 16 | Ht 63.75 in | Wt 137.2 lb

## 2015-03-23 DIAGNOSIS — D235 Other benign neoplasm of skin of trunk: Secondary | ICD-10-CM

## 2015-03-23 DIAGNOSIS — D225 Melanocytic nevi of trunk: Secondary | ICD-10-CM

## 2015-03-23 DIAGNOSIS — Z79899 Other long term (current) drug therapy: Secondary | ICD-10-CM

## 2015-03-23 DIAGNOSIS — Z0000 Encounter for general adult medical examination without abnormal findings: Secondary | ICD-10-CM

## 2015-03-23 LAB — CBC WITH DIFFERENTIAL/PLATELET
Basophils Absolute: 0 10*3/uL (ref 0.0–0.1)
Basophils Relative: 0.7 % (ref 0.0–3.0)
Eosinophils Absolute: 0.5 10*3/uL (ref 0.0–0.7)
Eosinophils Relative: 8.3 % — ABNORMAL HIGH (ref 0.0–5.0)
HEMATOCRIT: 41 % (ref 36.0–46.0)
Hemoglobin: 14 g/dL (ref 12.0–15.0)
LYMPHS ABS: 1.9 10*3/uL (ref 0.7–4.0)
LYMPHS PCT: 28.6 % (ref 12.0–46.0)
MCHC: 34.2 g/dL (ref 30.0–36.0)
MCV: 93 fl (ref 78.0–100.0)
Monocytes Absolute: 0.5 10*3/uL (ref 0.1–1.0)
Monocytes Relative: 8.1 % (ref 3.0–12.0)
NEUTROS ABS: 3.5 10*3/uL (ref 1.4–7.7)
NEUTROS PCT: 54.3 % (ref 43.0–77.0)
Platelets: 282 10*3/uL (ref 150.0–400.0)
RBC: 4.41 Mil/uL (ref 3.87–5.11)
RDW: 13.6 % (ref 11.5–15.5)
WBC: 6.5 10*3/uL (ref 4.0–10.5)

## 2015-03-23 LAB — HEPATIC FUNCTION PANEL
ALBUMIN: 4.4 g/dL (ref 3.5–5.2)
ALT: 18 U/L (ref 0–35)
AST: 20 U/L (ref 0–37)
Alkaline Phosphatase: 66 U/L (ref 39–117)
BILIRUBIN TOTAL: 0.4 mg/dL (ref 0.2–1.2)
Bilirubin, Direct: 0.1 mg/dL (ref 0.0–0.3)
TOTAL PROTEIN: 7.1 g/dL (ref 6.0–8.3)

## 2015-03-23 LAB — BASIC METABOLIC PANEL
BUN: 10 mg/dL (ref 6–23)
CALCIUM: 9.1 mg/dL (ref 8.4–10.5)
CHLORIDE: 104 meq/L (ref 96–112)
CO2: 27 mEq/L (ref 19–32)
Creatinine, Ser: 0.6 mg/dL (ref 0.40–1.20)
GFR: 119.95 mL/min (ref >60.00)
GLUCOSE: 89 mg/dL (ref 70–99)
Potassium: 3.9 mEq/L (ref 3.5–5.1)
SODIUM: 137 meq/L (ref 135–145)

## 2015-03-23 LAB — LIPID PANEL
CHOLESTEROL: 177 mg/dL (ref 0–200)
HDL: 55.5 mg/dL (ref >39.00)
LDL CALC: 104 mg/dL — AB (ref 0–99)
NonHDL: 121.5
Total CHOL/HDL Ratio: 3
Triglycerides: 86 mg/dL (ref 0.0–149.0)
VLDL: 17.2 mg/dL (ref 0.0–40.0)

## 2015-03-23 LAB — TSH: TSH: 1.73 u[IU]/mL (ref 0.35–4.50)

## 2015-03-23 LAB — VITAMIN D 25 HYDROXY (VIT D DEFICIENCY, FRACTURES): VITD: 15.38 ng/mL — AB (ref 30.00–100.00)

## 2015-03-23 MED ORDER — ALBUTEROL SULFATE HFA 108 (90 BASE) MCG/ACT IN AERS: 2.0000 | INHALATION_SPRAY | Freq: Four times a day (QID) | RESPIRATORY_TRACT | Status: DC | PRN

## 2015-03-23 MED ORDER — ALPRAZOLAM 0.5 MG PO TABS: 0.5000 mg | ORAL_TABLET | Freq: Every evening | ORAL | Status: DC | PRN

## 2015-03-23 MED ORDER — BUDESONIDE-FORMOTEROL FUMARATE 80-4.5 MCG/ACT IN AERO: 2.0000 | INHALATION_SPRAY | Freq: Two times a day (BID) | RESPIRATORY_TRACT | Status: DC | PRN

## 2015-03-23 MED ORDER — FLUOXETINE HCL 20 MG PO TABS: 20.0000 mg | ORAL_TABLET | Freq: Every day | ORAL | Status: DC

## 2015-03-23 NOTE — Patient Instructions (Signed)
Follow up in 1 year or as needed We'll notify you of your lab results and make any changes if needed We'll call you with your derm referral for the moles Keep up the good work!  You look great! Call with any questions or concerns Kramer Day!!!

## 2015-03-23 NOTE — Assessment & Plan Note (Signed)
Pt's PE WNL w/ exception of scattered atypical moles (Derm referral entered).  Pt to call GYN and schedule.  Check labs.  Anticipatory guidance provided.

## 2015-03-23 NOTE — Progress Notes (Signed)
   Subjective:    Patient ID: Cassandra Gross, female    DOB: [birth year], 39 y.o.   MRN: [id number]  HPI CPE- has GYN ([facility]), due for appt.  Plans to call and schedule.  Pt interested in Derm referral for skin check   Review of Systems Patient reports no vision/ hearing changes, adenopathy,fever, weight change,  persistant/recurrent hoarseness , swallowing issues, chest pain, palpitations, edema, persistant/recurrent cough, hemoptysis, dyspnea (rest/exertional/paroxysmal nocturnal), gastrointestinal bleeding (melena, rectal bleeding), abdominal pain, significant heartburn, bowel changes, GU symptoms (dysuria, hematuria, incontinence), Gyn symptoms (abnormal  bleeding, pain),  syncope, focal weakness, memory loss, numbness & tingling, skin/hair/nail changes, abnormal bruising or bleeding, anxiety, or depression.     Objective:   Physical Exam General Appearance:    Alert, cooperative, no distress, appears stated age  Head:    Normocephalic, without obvious abnormality, atraumatic  Eyes:    PERRL, conjunctiva/corneas clear, EOM's intact, fundi    benign, both eyes  Ears:    Normal TM's and external ear canals, both ears  Nose:   Nares normal, septum midline, mucosa normal, no drainage    or sinus tenderness  Throat:   Lips, mucosa, and tongue normal; teeth and gums normal  Neck:   Supple, symmetrical, trachea midline, no adenopathy;    Thyroid: no enlargement/tenderness/nodules  Back:     Symmetric, no curvature, ROM normal, no CVA tenderness  Lungs:     Clear to auscultation bilaterally, respirations unlabored  Chest Wall:    No tenderness or deformity   Heart:    Regular rate and rhythm, S1 and S2 normal, no murmur, rub   or gallop  Breast Exam:    Deferred to GYN  Abdomen:     Soft, non-tender, bowel sounds active all four quadrants,    no masses, no organomegaly  Genitalia:    Deferred to GYN  Rectal:    Extremities:   Extremities normal, atraumatic, no cyanosis or edema    Pulses:   2+ and symmetric all extremities  Skin:   Skin color, texture, turgor normal, no rashes or lesions, scattered atypically colored moles  Lymph nodes:   Cervical, supraclavicular, and axillary nodes normal  Neurologic:   CNII-XII intact, normal strength, sensation and reflexes    throughout          Assessment & Plan:

## 2015-03-23 NOTE — Progress Notes (Signed)
Pre visit review using our clinic review tool, if applicable. No additional management support is needed unless otherwise documented below in the visit note. 

## 2015-03-24 ENCOUNTER — Encounter: Payer: Self-pay | Admitting: Family Medicine

## 2015-03-25 ENCOUNTER — Other Ambulatory Visit: Payer: Self-pay | Admitting: General Practice

## 2015-03-25 ENCOUNTER — Encounter: Payer: Self-pay | Admitting: General Practice

## 2015-03-25 MED ORDER — VITAMIN D (ERGOCALCIFEROL) 1.25 MG (50000 UNIT) PO CAPS: 50000.0000 [IU] | ORAL_CAPSULE | ORAL | Status: DC

## 2015-05-12 LAB — HM PAP SMEAR: HM Pap smear: NORMAL

## 2015-05-13 ENCOUNTER — Encounter: Payer: Self-pay | Admitting: General Practice

## 2015-05-23 ENCOUNTER — Other Ambulatory Visit: Payer: Self-pay | Admitting: Family Medicine

## 2015-12-05 ENCOUNTER — Other Ambulatory Visit: Payer: Self-pay | Admitting: Family Medicine

## 2015-12-06 NOTE — Telephone Encounter (Signed)
Medication filled to pharmacy as requested.   

## 2015-12-06 NOTE — Telephone Encounter (Signed)
Last OV 03/23/15, Next appt 03/24/16 (CPE) Alprazolam last filled 03/23/15 #30 with 1

## 2015-12-31 ENCOUNTER — Other Ambulatory Visit: Payer: Self-pay | Admitting: Family Medicine

## 2016-01-02 NOTE — Telephone Encounter (Signed)
Medication filled to pharmacy as requested.   

## 2016-02-03 DIAGNOSIS — Z3049 Encounter for surveillance of other contraceptives: Secondary | ICD-10-CM | POA: Diagnosis not present

## 2016-03-06 ENCOUNTER — Ambulatory Visit (INDEPENDENT_AMBULATORY_CARE_PROVIDER_SITE_OTHER): Payer: BLUE CROSS/BLUE SHIELD | Admitting: Physician Assistant

## 2016-03-06 ENCOUNTER — Encounter: Payer: Self-pay | Admitting: Physician Assistant

## 2016-03-06 VITALS — BP 98/62 | HR 98 | Temp 99.2°F | Resp 18 | Ht 63.75 in | Wt 151.0 lb

## 2016-03-06 DIAGNOSIS — J029 Acute pharyngitis, unspecified: Secondary | ICD-10-CM | POA: Diagnosis not present

## 2016-03-06 DIAGNOSIS — B9789 Other viral agents as the cause of diseases classified elsewhere: Principal | ICD-10-CM

## 2016-03-06 DIAGNOSIS — J069 Acute upper respiratory infection, unspecified: Secondary | ICD-10-CM

## 2016-03-06 LAB — POCT RAPID STREP A (OFFICE): RAPID STREP A SCREEN: NEGATIVE

## 2016-03-06 MED ORDER — FLUTICASONE PROPIONATE 50 MCG/ACT NA SUSP: 2.0000 | Freq: Every day | NASAL | Status: DC

## 2016-03-06 MED ORDER — BENZONATATE 100 MG PO CAPS: 100.0000 mg | ORAL_CAPSULE | Freq: Three times a day (TID) | ORAL | Status: DC | PRN

## 2016-03-06 NOTE — Progress Notes (Signed)
Pre visit review using our clinic review tool, if applicable. No additional management support is needed unless otherwise documented below in the visit note/SLS  

## 2016-03-06 NOTE — Patient Instructions (Signed)
Please keep well-hydrated. Tylenol if needed for fever and sore throat.  Salt-water gargles may be beneficial. Use the Flonase and Tessalon as directed for sinus congestion and cough. Saline nasal spray will also be beneficial.  I will call with your throat culture results. We will start antibiotics if positive for strep but again your quick test was negative and your exam seems consistent with a viral URI.  Symptoms should improve over the next 3-4 days.  Upper Respiratory Infection, Adult Most upper respiratory infections (URIs) are caused by a virus. A URI affects the nose, throat, and upper air passages. The most common type of URI is often called "the common cold." HOME CARE   Take medicines only as told by your doctor.  Gargle warm saltwater or take cough drops to comfort your throat as told by your doctor.  Use a warm mist humidifier or inhale steam from a shower to increase air moisture. This may make it easier to breathe.  Drink enough fluid to keep your pee (urine) clear or pale yellow.  Eat soups and other clear broths.  Have a healthy diet.  Rest as needed.  Go back to work when your fever is gone or your doctor says it is okay.  You may need to stay home longer to avoid giving your URI to others.  You can also wear a face mask and wash your hands often to prevent spread of the virus.  Use your inhaler more if you have asthma.  Do not use any tobacco products, including cigarettes, chewing tobacco, or electronic cigarettes. If you need help quitting, ask your doctor. GET HELP IF:  You are getting worse, not better.  Your symptoms are not helped by medicine.  You have chills.  You are getting more short of breath.  You have brown or red mucus.  You have yellow or brown discharge from your nose.  You have pain in your face, especially when you bend forward.  You have a fever.  You have puffy (swollen) neck glands.  You have pain while  swallowing.  You have white areas in the back of your throat. GET HELP RIGHT AWAY IF:   You have very bad or constant:  Headache.  Ear pain.  Pain in your forehead, behind your eyes, and over your cheekbones (sinus pain).  Chest pain.  You have long-lasting (chronic) lung disease and any of the following:  Wheezing.  Long-lasting cough.  Coughing up blood.  A change in your usual mucus.  You have a stiff neck.  You have changes in your:  Vision.  Hearing.  Thinking.  Mood. MAKE SURE YOU:   Understand these instructions.  Will watch your condition.  Will get help right away if you are not doing well or get worse.   This information is not intended to replace advice given to you by your health care provider. Make sure you discuss any questions you have with your health care provider.   Document Released: 04/02/2008 Document Revised: 03/01/2015 Document Reviewed: 01/20/2014 Elsevier Interactive Patient Education [employer].

## 2016-03-07 NOTE — Progress Notes (Signed)
Patient presents to clinic today c/o 1.5 days of dry cough, sore throat and fatigue. Denies sinus pressure, sinus pain. Notes mild PND. Is unsure of fever but states she had chills yesterday. Has history of asthma but denies SOB or wheezing. Has not had to use inhalers. Denies recent travel.  Past Medical History  Diagnosis Date  . Asthma   . HSV (herpes simplex virus) anogenital infection   . Anxiety     Current Outpatient Prescriptions on File Prior to Visit  Medication Sig Dispense Refill  . albuterol (PROVENTIL HFA;VENTOLIN HFA) 108 (90 BASE) MCG/ACT inhaler Inhale 2 puffs into the lungs every 6 (six) hours as needed for wheezing or shortness of breath. 6.7 g 3  . ALPRAZolam (XANAX) 0.5 MG tablet TAKE 1 TABLET BY MOUTH AT BEDTIME AS NEEDED FOR ANXIETY 30 tablet 1  . budesonide-formoterol (SYMBICORT) 80-4.5 MCG/ACT inhaler Inhale 2 puffs into the lungs 2 (two) times daily as needed. 1 Inhaler 6  . Etonogestrel (IMPLANON [place]) Inject into the skin.    Marland Kitchen FLUoxetine (PROZAC) 20 MG tablet TAKE 1 TABLET EVERY DAY 30 tablet 3  . Multiple Vitamin (MULTIVITAMIN) tablet Take 1 tablet by mouth daily.    . Probiotic Product (PROBIOTIC DAILY PO) Take by mouth.     No current facility-administered medications on file prior to visit.    Allergies  Allergen Reactions  . Shellfish Allergy Other (See Comments)    Skin test   . Penicillins Rash    infant    Family History  Problem Relation Age of Onset  . Asthma Mother   . Hyperlipidemia Father   . Hypertension Father   . Asthma Maternal Grandfather     Social History   Social History  . Marital Status: Married    Spouse Name: N/A  . Number of Children: N/A  . Years of Education: N/A   Social History Main Topics  . Smoking status: Never Smoker   . Smokeless tobacco: Never Used  . Alcohol Use: No  . Drug Use: No  . Sexual Activity: Yes   Other Topics Concern  . None   Social History Narrative    Review of Systems - See  HPI.  All other ROS are negative.  BP 98/62 mmHg  Pulse 98  Temp(Src) 99.2 F (37.3 C) (Oral)  Resp 18  Ht 5' 3.75" (1.619 m)  Wt 151 lb (68.493 kg)  BMI 26.13 kg/m2  SpO2 98%  Physical Exam  Constitutional: She is oriented to person, place, and time and well-developed, well-nourished, and in no distress.  HENT:  Head: Normocephalic and atraumatic.  Left Ear: Tympanic membrane normal.  Nose: Nose normal.  Mouth/Throat: Uvula is midline and mucous membranes are normal. Posterior oropharyngeal erythema present. No oropharyngeal exudate, posterior oropharyngeal edema or tonsillar abscesses.  Eyes: Conjunctivae are normal.  Neck: Neck supple.  Cardiovascular: Normal rate, regular rhythm, normal heart sounds and intact distal pulses.   Pulmonary/Chest: Effort normal and breath sounds normal. No respiratory distress. She has no wheezes. She has no rales. She exhibits no tenderness.  Neurological: She is alert and oriented to person, place, and time.  Skin: Skin is warm and dry. No rash noted.  Psychiatric: Affect normal.  Vitals reviewed.   Recent Results (from the past 2160 hour(s))  POCT rapid strep A     Status: None   Collection Time: 03/06/16  5:13 PM  Result Value Ref Range   Rapid Strep A Screen Negative Negative  Assessment/Plan: 1. Viral URI with cough 1.5 days of symptoms. Exam unremarkable for signs of bacterial infection. Rapid strep negative. Rx Tessalon and Flonase. Supportive measures and OTC medications reviewed.  - benzonatate (TESSALON) 100 MG capsule; Take 1 capsule (100 mg total) by mouth 3 (three) times daily as needed.  Dispense: 30 capsule; Refill: 0 - fluticasone (FLONASE) 50 MCG/ACT nasal spray; Place 2 sprays into both nostrils daily.  Dispense: 16 g; Refill: 6 - Culture, Group A Strep - POCT rapid strep A  2. Sore throat Rapid strep negative. Will send for culture. Exam consistent with Viral URI. See A/P.  - Culture, Group A Strep - POCT rapid  strep A  Leeanne Rio, PA-C

## 2016-03-08 LAB — CULTURE, GROUP A STREP: ORGANISM ID, BACTERIA: NORMAL

## 2016-03-09 ENCOUNTER — Ambulatory Visit (INDEPENDENT_AMBULATORY_CARE_PROVIDER_SITE_OTHER): Payer: BLUE CROSS/BLUE SHIELD | Admitting: Physician Assistant

## 2016-03-09 ENCOUNTER — Encounter: Payer: Self-pay | Admitting: Physician Assistant

## 2016-03-09 VITALS — BP 111/68 | HR 85 | Temp 98.4°F | Resp 16 | Ht 63.75 in | Wt 153.4 lb

## 2016-03-09 DIAGNOSIS — J208 Acute bronchitis due to other specified organisms: Principal | ICD-10-CM

## 2016-03-09 DIAGNOSIS — B9689 Other specified bacterial agents as the cause of diseases classified elsewhere: Secondary | ICD-10-CM

## 2016-03-09 DIAGNOSIS — J00 Acute nasopharyngitis [common cold]: Secondary | ICD-10-CM

## 2016-03-09 MED ORDER — AZITHROMYCIN 250 MG PO TABS: ORAL_TABLET | ORAL | Status: DC

## 2016-03-09 NOTE — Progress Notes (Signed)
Patient presents to clinic today c/o worsening cough now with chest congestion, voice hoarseness and continued sore throat. Has noted drainage from right eye in the morning over the past couple of days. Denies fever, chills or SOB. Denies recent travel or sick contact.   Past Medical History  Diagnosis Date  . Asthma   . HSV (herpes simplex virus) anogenital infection   . Anxiety     Current Outpatient Prescriptions on File Prior to Visit  Medication Sig Dispense Refill  . albuterol (PROVENTIL HFA;VENTOLIN HFA) 108 (90 BASE) MCG/ACT inhaler Inhale 2 puffs into the lungs every 6 (six) hours as needed for wheezing or shortness of breath. 6.7 g 3  . ALPRAZolam (XANAX) 0.5 MG tablet TAKE 1 TABLET BY MOUTH AT BEDTIME AS NEEDED FOR ANXIETY 30 tablet 1  . benzonatate (TESSALON) 100 MG capsule Take 1 capsule (100 mg total) by mouth 3 (three) times daily as needed. 30 capsule 0  . budesonide-formoterol (SYMBICORT) 80-4.5 MCG/ACT inhaler Inhale 2 puffs into the lungs 2 (two) times daily as needed. 1 Inhaler 6  . Etonogestrel (IMPLANON [place]) Inject into the skin.    Marland Kitchen FLUoxetine (PROZAC) 20 MG tablet TAKE 1 TABLET EVERY DAY 30 tablet 3  . fluticasone (FLONASE) 50 MCG/ACT nasal spray Place 2 sprays into both nostrils daily. 16 g 6  . Multiple Vitamin (MULTIVITAMIN) tablet Take 1 tablet by mouth daily.    . Probiotic Product (PROBIOTIC DAILY PO) Take by mouth.     No current facility-administered medications on file prior to visit.    Allergies  Allergen Reactions  . Shellfish Allergy Other (See Comments)    Skin test   . Penicillins Rash    infant    Family History  Problem Relation Age of Onset  . Asthma Mother   . Hyperlipidemia Father   . Hypertension Father   . Asthma Maternal Grandfather     Social History   Social History  . Marital Status: Married    Spouse Name: N/A  . Number of Children: N/A  . Years of Education: N/A   Social History Main Topics  . Smoking status:  Never Smoker   . Smokeless tobacco: Never Used  . Alcohol Use: No  . Drug Use: No  . Sexual Activity: Yes   Other Topics Concern  . None   Social History Narrative   Review of Systems - See HPI.  All other ROS are negative.  BP 111/68 mmHg  Pulse 85  Temp(Src) 98.4 F (36.9 C) (Oral)  Resp 16  Ht 5' 3.75" (1.619 m)  Wt 153 lb 6 oz (69.57 kg)  BMI 26.54 kg/m2  Physical Exam  Constitutional: She is oriented to person, place, and time and well-developed, well-nourished, and in no distress.  HENT:  Head: Normocephalic and atraumatic.  Right Ear: Tympanic membrane normal.  Left Ear: Tympanic membrane normal.  Nose: Nose normal.  Mouth/Throat: Uvula is midline and mucous membranes are normal. Posterior oropharyngeal erythema present. No oropharyngeal exudate or posterior oropharyngeal edema.  Eyes: Conjunctivae are normal.  Neck: Neck supple.  Cardiovascular: Normal rate, regular rhythm, normal heart sounds and intact distal pulses.   Pulmonary/Chest: Effort normal and breath sounds normal. No respiratory distress. She has no wheezes. She has no rales. She exhibits no tenderness.  Lymphadenopathy:    She has no cervical adenopathy.  Neurological: She is alert and oriented to person, place, and time.  Skin: Skin is warm and dry. No rash noted.  Psychiatric: Affect  normal.  Vitals reviewed.   Recent Results (from the past 2160 hour(s))  POCT rapid strep A     Status: None   Collection Time: 03/06/16  5:13 PM  Result Value Ref Range   Rapid Strep A Screen Negative Negative  Culture, Group A Strep     Status: None   Collection Time: 03/06/16  5:16 PM  Result Value Ref Range   Organism ID, Bacteria Normal Upper Respiratory Flora    Organism ID, Bacteria No Beta Hemolytic Streptococci Isolated     Assessment/Plan: 1. Acute bacterial bronchitis Rapid strep and throat culture negative at last visit. Symptoms progressing. Will treat for bronchitis. ABX sent in. Continue  Tessalon as directed. Supportive measures and OTC medications reviewed.  - azithromycin (ZITHROMAX) 250 MG tablet; Take 2 tablets on Day 1. Then take 1 tablet daily.  Dispense: 6 tablet; Refill: 0

## 2016-03-09 NOTE — Patient Instructions (Signed)
Take antibiotic (Azithromycin) as directed.  Increase fluids.  Get plenty of rest. Use Mucinex for congestion. Continue Tessalon for cough. Take a daily probiotic (I recommend Align or Culturelle, but even Activia Yogurt may be beneficial).  A humidifier placed in the bedroom may offer some relief for a dry, scratchy throat of nasal irritation.  Read information below on acute bronchitis. Please call or return to clinic if symptoms are not improving.  Acute Bronchitis Bronchitis is when the airways that extend from the windpipe into the lungs get red, puffy, and painful (inflamed). Bronchitis often causes thick spit (mucus) to develop. This leads to a cough. A cough is the most common symptom of bronchitis. In acute bronchitis, the condition usually begins suddenly and goes away over time (usually in 2 weeks). Smoking, allergies, and asthma can make bronchitis worse. Repeated episodes of bronchitis may cause more lung problems.  HOME CARE  Rest.  Drink enough fluids to keep your pee (urine) clear or pale yellow (unless you need to limit fluids as told by your doctor).  Only take over-the-counter or prescription medicines as told by your doctor.  Avoid smoking and secondhand smoke. These can make bronchitis worse. If you are a smoker, think about using nicotine gum or skin patches. Quitting smoking will help your lungs heal faster.  Reduce the chance of getting bronchitis again by:  Washing your hands often.  Avoiding people with cold symptoms.  Trying not to touch your hands to your mouth, nose, or eyes.  Follow up with your doctor as told.  GET HELP IF: Your symptoms do not improve after 1 week of treatment. Symptoms include:  Cough.  Fever.  Coughing up thick spit.  Body aches.  Chest congestion.  Chills.  Shortness of breath.  Sore throat.  GET HELP RIGHT AWAY IF:   You have an increased fever.  You have chills.  You have severe shortness of breath.  You have  bloody thick spit (sputum).  You throw up (vomit) often.  You lose too much body fluid (dehydration).  You have a severe headache.  You faint.  MAKE SURE YOU:   Understand these instructions.  Will watch your condition.  Will get help right away if you are not doing well or get worse. Document Released: 04/02/2008 Document Revised: 06/17/2013 Document Reviewed: 04/07/2013 [facility] Patient Information 2015 [place], [place]. This information is not intended to replace advice given to you by your health care provider. Make sure you discuss any questions you have with your health care provider.

## 2016-03-09 NOTE — Progress Notes (Signed)
Pre visit review using our clinic review tool, if applicable. No additional management support is needed unless otherwise documented below in the visit note/SLS  

## 2016-03-28 ENCOUNTER — Encounter: Payer: Self-pay | Admitting: Family Medicine

## 2016-03-28 ENCOUNTER — Ambulatory Visit (INDEPENDENT_AMBULATORY_CARE_PROVIDER_SITE_OTHER): Payer: BLUE CROSS/BLUE SHIELD | Admitting: Family Medicine

## 2016-03-28 VITALS — BP 106/70 | HR 62 | Temp 98.3°F | Resp 16 | Ht 64.0 in | Wt 153.0 lb

## 2016-03-28 DIAGNOSIS — D229 Melanocytic nevi, unspecified: Secondary | ICD-10-CM

## 2016-03-28 DIAGNOSIS — Z0000 Encounter for general adult medical examination without abnormal findings: Secondary | ICD-10-CM | POA: Diagnosis not present

## 2016-03-28 LAB — CBC WITH DIFFERENTIAL/PLATELET
BASOS PCT: 0.7 % (ref 0.0–3.0)
Basophils Absolute: 0 10*3/uL (ref 0.0–0.1)
EOS ABS: 0.5 10*3/uL (ref 0.0–0.7)
EOS PCT: 7.1 % — AB (ref 0.0–5.0)
HCT: 41.9 % (ref 36.0–46.0)
Hemoglobin: 14 g/dL (ref 12.0–15.0)
LYMPHS ABS: 1.9 10*3/uL (ref 0.7–4.0)
Lymphocytes Relative: 29.8 % (ref 12.0–46.0)
MCHC: 33.3 g/dL (ref 30.0–36.0)
MCV: 95 fl (ref 78.0–100.0)
MONO ABS: 0.5 10*3/uL (ref 0.1–1.0)
Monocytes Relative: 7.9 % (ref 3.0–12.0)
NEUTROS ABS: 3.5 10*3/uL (ref 1.4–7.7)
NEUTROS PCT: 54.5 % (ref 43.0–77.0)
PLATELETS: 303 10*3/uL (ref 150.0–400.0)
RBC: 4.41 Mil/uL (ref 3.87–5.11)
RDW: 14.1 % (ref 11.5–15.5)
WBC: 6.4 10*3/uL (ref 4.0–10.5)

## 2016-03-28 LAB — BASIC METABOLIC PANEL
BUN: 10 mg/dL (ref 6–23)
CHLORIDE: 103 meq/L (ref 96–112)
CO2: 28 meq/L (ref 19–32)
CREATININE: 0.6 mg/dL (ref 0.40–1.20)
Calcium: 9 mg/dL (ref 8.4–10.5)
GFR: 119.28 mL/min (ref >60.00)
Glucose, Bld: 91 mg/dL (ref 70–99)
POTASSIUM: 4.4 meq/L (ref 3.5–5.1)
Sodium: 137 mEq/L (ref 135–145)

## 2016-03-28 LAB — LIPID PANEL
Cholesterol: 198 mg/dL (ref 0–200)
HDL: 55.5 mg/dL (ref >39.00)
LDL Cholesterol: 128 mg/dL — ABNORMAL HIGH (ref 0–99)
NONHDL: 142.81
TRIGLYCERIDES: 75 mg/dL (ref 0.0–149.0)
Total CHOL/HDL Ratio: 4
VLDL: 15 mg/dL (ref 0.0–40.0)

## 2016-03-28 LAB — HEPATIC FUNCTION PANEL
ALK PHOS: 61 U/L (ref 39–117)
ALT: 24 U/L (ref 0–35)
AST: 21 U/L (ref 0–37)
Albumin: 4.6 g/dL (ref 3.5–5.2)
BILIRUBIN DIRECT: 0.1 mg/dL (ref 0.0–0.3)
BILIRUBIN TOTAL: 0.6 mg/dL (ref 0.2–1.2)
Total Protein: 7 g/dL (ref 6.0–8.3)

## 2016-03-28 LAB — TSH: TSH: 3.64 u[IU]/mL (ref 0.35–4.50)

## 2016-03-28 LAB — VITAMIN D 25 HYDROXY (VIT D DEFICIENCY, FRACTURES): VITD: 18.07 ng/mL — AB (ref 30.00–100.00)

## 2016-03-28 NOTE — Assessment & Plan Note (Signed)
Pt's PE WNL.  UTD on GYN.  Check labs.  Anticipatory guidance provided.  

## 2016-03-28 NOTE — Progress Notes (Signed)
   Subjective:    Patient ID: Cassandra Gross, female    DOB: 07-28-1976, 40 y.o.   MRN: [id number]  HPI CPE- UTD on GYN ([facility]).  No concerns today.   Review of Systems Patient reports no vision/ hearing changes, adenopathy,fever, weight change,  persistant/recurrent hoarseness , swallowing issues, chest pain, palpitations, edema, persistant/recurrent cough, hemoptysis, dyspnea (rest/exertional/paroxysmal nocturnal), gastrointestinal bleeding (melena, rectal bleeding), abdominal pain, significant heartburn, bowel changes, GU symptoms (dysuria, hematuria, incontinence), Gyn symptoms (abnormal  bleeding, pain),  syncope, focal weakness, memory loss, numbness & tingling, skin/hair/nail changes, abnormal bruising or bleeding, anxiety, or depression.     Objective:   Physical Exam General Appearance:    Alert, cooperative, no distress, appears stated age  Head:    Normocephalic, without obvious abnormality, atraumatic  Eyes:    PERRL, conjunctiva/corneas clear, EOM's intact, fundi    benign, both eyes  Ears:    Normal TM's and external ear canals, both ears  Nose:   Nares normal, septum midline, mucosa normal, no drainage    or sinus tenderness  Throat:   Lips, mucosa, and tongue normal; teeth and gums normal  Neck:   Supple, symmetrical, trachea midline, no adenopathy;    Thyroid: no enlargement/tenderness/nodules  Back:     Symmetric, no curvature, ROM normal, no CVA tenderness  Lungs:     Clear to auscultation bilaterally, respirations unlabored  Chest Wall:    No tenderness or deformity   Heart:    Regular rate and rhythm, S1 and S2 normal, no murmur, rub   or gallop  Breast Exam:    Deferred to GYN  Abdomen:     Soft, non-tender, bowel sounds active all four quadrants,    no masses, no organomegaly  Genitalia:    Deferred to GYN  Rectal:    Extremities:   Extremities normal, atraumatic, no cyanosis or edema  Pulses:   2+ and symmetric all extremities  Skin:   Skin color,  texture, turgor normal, no rashes or lesions  Lymph nodes:   Cervical, supraclavicular, and axillary nodes normal  Neurologic:   CNII-XII intact, normal strength, sensation and reflexes    throughout          Assessment & Plan:

## 2016-03-28 NOTE — Addendum Note (Signed)
Addended by: Clyde Lundborg A on: 03/28/2016 08:53 AM   Modules accepted: Miquel Dunn

## 2016-03-28 NOTE — Patient Instructions (Signed)
Follow up in 1 year or as needed We'll notify you of your lab results and make any changes if needed Keep up the good work on healthy diet and regular exercise- you can do it! Call with any questions or concerns Have a great summer!!!

## 2016-03-28 NOTE — Progress Notes (Signed)
Pre visit review using our clinic review tool, if applicable. No additional management support is needed unless otherwise documented below in the visit note. 

## 2016-03-29 ENCOUNTER — Other Ambulatory Visit: Payer: Self-pay | Admitting: General Practice

## 2016-03-29 MED ORDER — VITAMIN D (ERGOCALCIFEROL) 1.25 MG (50000 UNIT) PO CAPS: 50000.0000 [IU] | ORAL_CAPSULE | ORAL | Status: DC

## 2016-05-23 DIAGNOSIS — D2239 Melanocytic nevi of other parts of face: Secondary | ICD-10-CM | POA: Diagnosis not present

## 2016-05-23 DIAGNOSIS — D224 Melanocytic nevi of scalp and neck: Secondary | ICD-10-CM | POA: Diagnosis not present

## 2016-05-23 DIAGNOSIS — D2261 Melanocytic nevi of right upper limb, including shoulder: Secondary | ICD-10-CM | POA: Diagnosis not present

## 2016-05-23 DIAGNOSIS — D485 Neoplasm of uncertain behavior of skin: Secondary | ICD-10-CM | POA: Diagnosis not present

## 2016-05-23 DIAGNOSIS — D225 Melanocytic nevi of trunk: Secondary | ICD-10-CM | POA: Diagnosis not present

## 2016-05-23 DIAGNOSIS — C44719 Basal cell carcinoma of skin of left lower limb, including hip: Secondary | ICD-10-CM | POA: Diagnosis not present

## 2016-07-09 ENCOUNTER — Other Ambulatory Visit: Payer: Self-pay | Admitting: Family Medicine

## 2016-07-15 ENCOUNTER — Other Ambulatory Visit: Payer: Self-pay | Admitting: Family Medicine

## 2016-07-16 NOTE — Telephone Encounter (Signed)
Last OV 03/28/16 Alprazolam last filled 12/06/15 #30 with 1

## 2016-07-16 NOTE — Telephone Encounter (Signed)
Medication filled to pharmacy as requested.   

## 2016-08-09 DIAGNOSIS — Z01419 Encounter for gynecological examination (general) (routine) without abnormal findings: Secondary | ICD-10-CM | POA: Diagnosis not present

## 2016-08-09 DIAGNOSIS — Z1389 Encounter for screening for other disorder: Secondary | ICD-10-CM | POA: Diagnosis not present

## 2016-08-09 DIAGNOSIS — Z3009 Encounter for other general counseling and advice on contraception: Secondary | ICD-10-CM | POA: Diagnosis not present

## 2016-08-09 DIAGNOSIS — Z6827 Body mass index (BMI) 27.0-27.9, adult: Secondary | ICD-10-CM | POA: Diagnosis not present

## 2016-08-09 DIAGNOSIS — Z130 Encounter for screening for diseases of the blood and blood-forming organs and certain disorders involving the immune mechanism: Secondary | ICD-10-CM | POA: Diagnosis not present

## 2016-11-16 ENCOUNTER — Other Ambulatory Visit: Payer: Self-pay | Admitting: Family Medicine

## 2016-11-23 DIAGNOSIS — Z23 Encounter for immunization: Secondary | ICD-10-CM | POA: Diagnosis not present

## 2016-11-23 DIAGNOSIS — C44719 Basal cell carcinoma of skin of left lower limb, including hip: Secondary | ICD-10-CM | POA: Diagnosis not present

## 2016-11-23 DIAGNOSIS — D2262 Melanocytic nevi of left upper limb, including shoulder: Secondary | ICD-10-CM | POA: Diagnosis not present

## 2016-11-23 DIAGNOSIS — Z85828 Personal history of other malignant neoplasm of skin: Secondary | ICD-10-CM | POA: Diagnosis not present

## 2016-11-23 DIAGNOSIS — D2261 Melanocytic nevi of right upper limb, including shoulder: Secondary | ICD-10-CM | POA: Diagnosis not present

## 2016-11-23 DIAGNOSIS — D224 Melanocytic nevi of scalp and neck: Secondary | ICD-10-CM | POA: Diagnosis not present

## 2016-12-04 DIAGNOSIS — M9904 Segmental and somatic dysfunction of sacral region: Secondary | ICD-10-CM | POA: Diagnosis not present

## 2016-12-04 DIAGNOSIS — M545 Low back pain: Secondary | ICD-10-CM | POA: Diagnosis not present

## 2016-12-04 DIAGNOSIS — M546 Pain in thoracic spine: Secondary | ICD-10-CM | POA: Diagnosis not present

## 2016-12-04 DIAGNOSIS — M9903 Segmental and somatic dysfunction of lumbar region: Secondary | ICD-10-CM | POA: Diagnosis not present

## 2016-12-07 DIAGNOSIS — M546 Pain in thoracic spine: Secondary | ICD-10-CM | POA: Diagnosis not present

## 2016-12-07 DIAGNOSIS — M545 Low back pain: Secondary | ICD-10-CM | POA: Diagnosis not present

## 2016-12-07 DIAGNOSIS — C44719 Basal cell carcinoma of skin of left lower limb, including hip: Secondary | ICD-10-CM | POA: Diagnosis not present

## 2016-12-07 DIAGNOSIS — M9904 Segmental and somatic dysfunction of sacral region: Secondary | ICD-10-CM | POA: Diagnosis not present

## 2016-12-07 DIAGNOSIS — M9903 Segmental and somatic dysfunction of lumbar region: Secondary | ICD-10-CM | POA: Diagnosis not present

## 2016-12-19 DIAGNOSIS — M545 Low back pain: Secondary | ICD-10-CM | POA: Diagnosis not present

## 2016-12-19 DIAGNOSIS — M9904 Segmental and somatic dysfunction of sacral region: Secondary | ICD-10-CM | POA: Diagnosis not present

## 2016-12-19 DIAGNOSIS — M9903 Segmental and somatic dysfunction of lumbar region: Secondary | ICD-10-CM | POA: Diagnosis not present

## 2016-12-19 DIAGNOSIS — M546 Pain in thoracic spine: Secondary | ICD-10-CM | POA: Diagnosis not present

## 2017-01-21 ENCOUNTER — Encounter: Payer: Self-pay | Admitting: Physician Assistant

## 2017-01-21 ENCOUNTER — Ambulatory Visit (INDEPENDENT_AMBULATORY_CARE_PROVIDER_SITE_OTHER): Payer: BLUE CROSS/BLUE SHIELD | Admitting: Physician Assistant

## 2017-01-21 VITALS — BP 120/82 | HR 97 | Temp 98.4°F | Resp 14 | Ht 64.0 in | Wt 159.0 lb

## 2017-01-21 DIAGNOSIS — L03311 Cellulitis of abdominal wall: Secondary | ICD-10-CM | POA: Diagnosis not present

## 2017-01-21 MED ORDER — DOXYCYCLINE HYCLATE 100 MG PO CAPS
100.0000 mg | ORAL_CAPSULE | Freq: Two times a day (BID) | ORAL | 0 refills | Status: DC
Start: 2017-01-21 — End: 2017-04-16

## 2017-01-21 NOTE — Patient Instructions (Signed)
Please start the antibiotic as directed. Avoid the use of adhesives as you have clearly had a reaction to the adhesive.  Take Benadryl tonight to help calm down the reaction. You can take a Claritin daily for a few days starting tomorrow to help while this calms down. Continue warm compresses.  Follow-up in 2 days. If not improving or any worsening symptoms or development of fever, you need to go to the ER.

## 2017-01-21 NOTE — Progress Notes (Signed)
Pre visit review using our clinic review tool, if applicable. No additional management support is needed unless otherwise documented below in the visit note. 

## 2017-01-21 NOTE — Progress Notes (Signed)
Patient presents to clinic today c/o 4-5 days of a painful bump on her lower R abdomen, at the waistline. Endorses area got slightly bigger after onset and had some slight pustular drainage. States she put a warm compress on the area to promote drainage. Last drainage was 2 days ago and was blood only. Endorses keeping area clean and dry. Placed a bandage over the area which caused the skin to get very red, irritated, itchy and tender. Denies fever, chill, nausea or vomiting.  Past Medical History:  Diagnosis Date  . Anxiety   . Asthma   . HSV (herpes simplex virus) anogenital infection     Current Outpatient Prescriptions on File Prior to Visit  Medication Sig Dispense Refill  . albuterol (PROVENTIL HFA;VENTOLIN HFA) 108 (90 BASE) MCG/ACT inhaler Inhale 2 puffs into the lungs every 6 (six) hours as needed for wheezing or shortness of breath. 6.7 g 3  . ALPRAZolam (XANAX) 0.5 MG tablet TAKE 1 TABLET BY MOUTH AT BEDTIME AS NEEDED FOR ANXIETY 30 tablet 3  . Etonogestrel (IMPLANON ) Inject into the skin.    Marland Kitchen FLUoxetine (PROZAC) 20 MG tablet TAKE 1 TABLET EVERY DAY 30 tablet 3  . Multiple Vitamin (MULTIVITAMIN) tablet Take 1 tablet by mouth daily.    . Probiotic Product (PROBIOTIC DAILY PO) Take by mouth.    . SYMBICORT 80-4.5 MCG/ACT inhaler INHALE 2 PUFFS INTO THE LUNGS 2 (TWO) TIMES DAILY AS NEEDED. 10.2 Inhaler 1   No current facility-administered medications on file prior to visit.     Allergies  Allergen Reactions  . Shellfish Allergy Other (See Comments)    Skin test   . Penicillins Rash    infant    Family History  Problem Relation Age of Onset  . Asthma Mother   . Hyperlipidemia Father   . Hypertension Father   . Asthma Maternal Grandfather     Social History   Social History  . Marital status: Married    Spouse name: N/A  . Number of children: N/A  . Years of education: N/A   Social History Main Topics  . Smoking status: Never Smoker  . Smokeless  tobacco: Never Used  . Alcohol use No  . Drug use: No  . Sexual activity: Yes   Other Topics Concern  . None   Social History Narrative  . None    Review of Systems - See HPI.  All other ROS are negative.  BP 120/82   Pulse 97   Temp 98.4 F (36.9 C) (Oral)   Resp 14   Ht 5\' 4"  (1.626 m)   Wt 159 lb (72.1 kg)   SpO2 99%   BMI 27.29 kg/m   Physical Exam  Constitutional: She is oriented to person, place, and time and well-developed, well-nourished, and in no distress.  HENT:  Head: Normocephalic and atraumatic.  Eyes: Conjunctivae are normal.  Neck: Neck supple.  Cardiovascular: Normal rate, regular rhythm, normal heart sounds and intact distal pulses.   Pulmonary/Chest: Effort normal and breath sounds normal. No respiratory distress. She has no wheezes. She has no rales. She exhibits no tenderness.  Neurological: She is alert and oriented to person, place, and time.  Skin: Skin is warm.     Psychiatric: Affect normal.  Vitals reviewed.  Assessment/Plan: 1. Cellulitis of abdominal wall Start Doxycycline. Warm compresses and other supportive measures discussed. OTC pain medications reviewed. For the area of allergic reaction from adhesive -- no further dressings to the area.  Start Benadryl at night and a claritin in the AM for a couple of days to help the reaction calm down. Close follow-up scheduled. Patient to proceed to ER if there is any worsening of symptoms or any development of fever.  - doxycycline (VIBRAMYCIN) 100 MG capsule; Take 1 capsule (100 mg total) by mouth 2 (two) times daily.  Dispense: 14 capsule; Refill: 0   Leeanne Rio, [place]

## 2017-01-23 ENCOUNTER — Encounter: Payer: Self-pay | Admitting: Physician Assistant

## 2017-01-23 ENCOUNTER — Ambulatory Visit (INDEPENDENT_AMBULATORY_CARE_PROVIDER_SITE_OTHER): Payer: BLUE CROSS/BLUE SHIELD | Admitting: Physician Assistant

## 2017-01-23 VITALS — BP 120/78 | HR 99 | Temp 98.6°F | Resp 14 | Ht 64.0 in | Wt 153.0 lb

## 2017-01-23 DIAGNOSIS — L03319 Cellulitis of trunk, unspecified: Secondary | ICD-10-CM | POA: Diagnosis not present

## 2017-01-23 DIAGNOSIS — L02219 Cutaneous abscess of trunk, unspecified: Secondary | ICD-10-CM | POA: Diagnosis not present

## 2017-01-23 NOTE — Progress Notes (Signed)
   Patient presents to clinic today for 2 day follow-up of abdominal wall cellulitis after started doxycycline. Patient is taking as directed. Denies side effect of medication. Has avoided use of the tape that previously caused a localized reaction. Notes this redness has resolved. Noted the area of concern started draining on its own one night ago. No major drainage at present. Feels symptoms are already improving. Denies fever, chills, malaise or fatigue.  Past Medical History:  Diagnosis Date  . Anxiety   . Asthma   . HSV (herpes simplex virus) anogenital infection     Current Outpatient Prescriptions on File Prior to Visit  Medication Sig Dispense Refill  . albuterol (PROVENTIL HFA;VENTOLIN HFA) 108 (90 BASE) MCG/ACT inhaler Inhale 2 puffs into the lungs every 6 (six) hours as needed for wheezing or shortness of breath. 6.7 g 3  . ALPRAZolam (XANAX) 0.5 MG tablet TAKE 1 TABLET BY MOUTH AT BEDTIME AS NEEDED FOR ANXIETY 30 tablet 3  . doxycycline (VIBRAMYCIN) 100 MG capsule Take 1 capsule (100 mg total) by mouth 2 (two) times daily. 14 capsule 0  . Etonogestrel (IMPLANON [place]) Inject into the skin.    Marland Kitchen FLUoxetine (PROZAC) 20 MG tablet TAKE 1 TABLET EVERY DAY 30 tablet 3  . Multiple Vitamin (MULTIVITAMIN) tablet Take 1 tablet by mouth daily.    . Probiotic Product (PROBIOTIC DAILY PO) Take by mouth.    . SYMBICORT 80-4.5 MCG/ACT inhaler INHALE 2 PUFFS INTO THE LUNGS 2 (TWO) TIMES DAILY AS NEEDED. 10.2 Inhaler 1   No current facility-administered medications on file prior to visit.     Allergies  Allergen Reactions  . Shellfish Allergy Other (See Comments)    Skin test   . Penicillins Rash    infant    Family History  Problem Relation Age of Onset  . Asthma Mother   . Hyperlipidemia Father   . Hypertension Father   . Asthma Maternal Grandfather     Social History   Social History  . Marital status: Married    Spouse name: N/A  . Number of children: N/A  . Years of  education: N/A   Social History Main Topics  . Smoking status: Never Smoker  . Smokeless tobacco: Never Used  . Alcohol use No  . Drug use: No  . Sexual activity: Yes   Other Topics Concern  . Not on file   Social History Narrative  . No narrative on file    Review of Systems - See HPI.  All other ROS are negative.  There were no vitals taken for this visit.  Physical Exam  Constitutional: She is oriented to person, place, and time and well-developed, well-nourished, and in no distress.  HENT:  Head: Normocephalic and atraumatic.  Eyes: Conjunctivae are normal.  Neck: Neck supple.  Cardiovascular: Normal rate, regular rhythm, normal heart sounds and intact distal pulses.   Pulmonary/Chest: Effort normal and breath sounds normal. No respiratory distress. She has no wheezes. She has no rales. She exhibits no tenderness.  Lymphadenopathy:    She has no cervical adenopathy.  Neurological: She is alert and oriented to person, place, and time.  Skin: Skin is warm and dry.     Psychiatric: Affect normal.  Vitals reviewed.  Assessment/Plan: 1. Cellulitis and abscess of trunk Improving. Scant drainage today. Culture sent. Continue Doxycycline as directed. Follow-up once antibiotics are complete. ER if anything worsens or new symptoms develop. - Wound culture   Leeanne Rio, PA-C

## 2017-01-23 NOTE — Progress Notes (Signed)
Pre visit review using our clinic review tool, if applicable. No additional management support is needed unless otherwise documented below in the visit note. 

## 2017-01-23 NOTE — Patient Instructions (Signed)
Continue warm compression to the areas.  Continue the paper tape as you are not reacting to it. Keep skin clean and dry. The area is improving. Continue antibiotics over the weekend. If not continuing to improve, please go to the ER for further assessment.

## 2017-01-24 ENCOUNTER — Telehealth: Payer: Self-pay | Admitting: Physician Assistant

## 2017-01-24 ENCOUNTER — Encounter: Payer: Self-pay | Admitting: Emergency Medicine

## 2017-01-24 NOTE — Telephone Encounter (Signed)
Called to assess patient's symptoms to ensure continuing to improve. No answer. LMOVM for call back.   Can we attempt to reach her again this afternoon. Just checking in with her as it is a long weekend and office is closed tomorrow.

## 2017-01-24 NOTE — Telephone Encounter (Signed)
LMOVM for patient to call back of status of the area is healing. My chart message sent as well.

## 2017-01-26 LAB — WOUND CULTURE
GRAM STAIN: NONE SEEN
Gram Stain: NONE SEEN
Gram Stain: NONE SEEN

## 2017-01-28 NOTE — Telephone Encounter (Signed)
Cassandra Gross advised patient of culture results and patient states the area was healing .

## 2017-03-23 ENCOUNTER — Other Ambulatory Visit: Payer: Self-pay | Admitting: Family Medicine

## 2017-03-30 ENCOUNTER — Other Ambulatory Visit: Payer: Self-pay | Admitting: Family Medicine

## 2017-04-01 ENCOUNTER — Encounter: Payer: Self-pay | Admitting: General Practice

## 2017-04-01 NOTE — Telephone Encounter (Signed)
Last OV 03/28/16 Alprazolam last filled 07/16/16 #30 with 3

## 2017-04-01 NOTE — Telephone Encounter (Signed)
[place] for #30- no med refills w/o appt (it has been >1 yr)

## 2017-04-12 ENCOUNTER — Encounter: Payer: Self-pay | Admitting: Family Medicine

## 2017-04-16 ENCOUNTER — Encounter: Payer: Self-pay | Admitting: Family Medicine

## 2017-04-16 ENCOUNTER — Ambulatory Visit (INDEPENDENT_AMBULATORY_CARE_PROVIDER_SITE_OTHER): Payer: BLUE CROSS/BLUE SHIELD | Admitting: Family Medicine

## 2017-04-16 VITALS — BP 122/80 | HR 98 | Temp 98.5°F | Resp 16 | Ht 64.0 in | Wt 158.4 lb

## 2017-04-16 DIAGNOSIS — E559 Vitamin D deficiency, unspecified: Secondary | ICD-10-CM | POA: Diagnosis not present

## 2017-04-16 DIAGNOSIS — Z0000 Encounter for general adult medical examination without abnormal findings: Secondary | ICD-10-CM | POA: Diagnosis not present

## 2017-04-16 LAB — CBC WITH DIFFERENTIAL/PLATELET
BASOS ABS: 0 10*3/uL (ref 0.0–0.1)
Basophils Relative: 0.8 % (ref 0.0–3.0)
EOS ABS: 0.2 10*3/uL (ref 0.0–0.7)
EOS PCT: 4.1 % (ref 0.0–5.0)
HCT: 45.5 % (ref 36.0–46.0)
HEMOGLOBIN: 15.4 g/dL — AB (ref 12.0–15.0)
Lymphocytes Relative: 31.4 % (ref 12.0–46.0)
Lymphs Abs: 1.8 10*3/uL (ref 0.7–4.0)
MCHC: 33.8 g/dL (ref 30.0–36.0)
MCV: 97.4 fl (ref 78.0–100.0)
Monocytes Absolute: 0.5 10*3/uL (ref 0.1–1.0)
Monocytes Relative: 8.3 % (ref 3.0–12.0)
Neutro Abs: 3.2 10*3/uL (ref 1.4–7.7)
Neutrophils Relative %: 55.4 % (ref 43.0–77.0)
Platelets: 271 10*3/uL (ref 150.0–400.0)
RBC: 4.68 Mil/uL (ref 3.87–5.11)
RDW: 13.4 % (ref 11.5–15.5)
WBC: 5.7 10*3/uL (ref 4.0–10.5)

## 2017-04-16 LAB — LIPID PANEL
CHOL/HDL RATIO: 5
Cholesterol: 205 mg/dL — ABNORMAL HIGH (ref 0–200)
HDL: 41.7 mg/dL (ref >39.00)
LDL CALC: 136 mg/dL — AB (ref 0–99)
NonHDL: 162.95
TRIGLYCERIDES: 134 mg/dL (ref 0.0–149.0)
VLDL: 26.8 mg/dL (ref 0.0–40.0)

## 2017-04-16 LAB — BASIC METABOLIC PANEL
BUN: 10 mg/dL (ref 6–23)
CHLORIDE: 101 meq/L (ref 96–112)
CO2: 29 mEq/L (ref 19–32)
CREATININE: 0.68 mg/dL (ref 0.40–1.20)
Calcium: 9.2 mg/dL (ref 8.4–10.5)
GFR: 102.66 mL/min (ref >60.00)
Glucose, Bld: 105 mg/dL — ABNORMAL HIGH (ref 70–99)
Potassium: 4.3 mEq/L (ref 3.5–5.1)
Sodium: 139 mEq/L (ref 135–145)

## 2017-04-16 LAB — HEPATIC FUNCTION PANEL
ALBUMIN: 4.5 g/dL (ref 3.5–5.2)
ALT: 35 U/L (ref 0–35)
AST: 25 U/L (ref 0–37)
Alkaline Phosphatase: 65 U/L (ref 39–117)
Bilirubin, Direct: 0.1 mg/dL (ref 0.0–0.3)
TOTAL PROTEIN: 6.9 g/dL (ref 6.0–8.3)
Total Bilirubin: 0.6 mg/dL (ref 0.2–1.2)

## 2017-04-16 LAB — VITAMIN D 25 HYDROXY (VIT D DEFICIENCY, FRACTURES): VITD: 17.49 ng/mL — AB (ref 30.00–100.00)

## 2017-04-16 LAB — TSH: TSH: 2.02 u[IU]/mL (ref 0.35–4.50)

## 2017-04-16 NOTE — Progress Notes (Signed)
Pre visit review using our clinic review tool, if applicable. No additional management support is needed unless otherwise documented below in the visit note. 

## 2017-04-16 NOTE — Progress Notes (Signed)
   Subjective:    Patient ID: Cassandra Gross, female    DOB: 08/01/1976, 41 y.o.   MRN: [id number]  HPI CPE- UTD on pap, Tdap.  No concerns today   Review of Systems Patient reports no vision/ hearing changes, adenopathy,fever, weight change,  persistant/recurrent hoarseness , swallowing issues, chest pain, palpitations, edema, persistant/recurrent cough, hemoptysis, dyspnea (rest/exertional/paroxysmal nocturnal), gastrointestinal bleeding (melena, rectal bleeding), abdominal pain, significant heartburn, bowel changes, GU symptoms (dysuria, hematuria, incontinence), Gyn symptoms (abnormal  bleeding, pain),  syncope, focal weakness, memory loss, numbness & tingling, skin/hair/nail changes, abnormal bruising or bleeding, anxiety, or depression.     Objective:   Physical Exam General Appearance:    Alert, cooperative, no distress, appears stated age  Head:    Normocephalic, without obvious abnormality, atraumatic  Eyes:    PERRL, conjunctiva/corneas clear, EOM's intact, fundi    benign, both eyes  Ears:    Normal TM's and external ear canals, both ears  Nose:   Nares normal, septum midline, mucosa normal, no drainage    or sinus tenderness  Throat:   Lips, mucosa, and tongue normal; teeth and gums normal  Neck:   Supple, symmetrical, trachea midline, no adenopathy;    Thyroid: no enlargement/tenderness/nodules  Back:     Symmetric, no curvature, ROM normal, no CVA tenderness  Lungs:     Clear to auscultation bilaterally, respirations unlabored  Chest Wall:    No tenderness or deformity   Heart:    Regular rate and rhythm, S1 and S2 normal, no murmur, rub   or gallop  Breast Exam:    Deferred to GYN  Abdomen:     Soft, non-tender, bowel sounds active all four quadrants,    no masses, no organomegaly  Genitalia:    Deferred to GYN  Rectal:    Extremities:   Extremities normal, atraumatic, no cyanosis or edema  Pulses:   2+ and symmetric all extremities  Skin:   Skin color, texture,  turgor normal, no rashes or lesions  Lymph nodes:   Cervical, supraclavicular, and axillary nodes normal  Neurologic:   CNII-XII intact, normal strength, sensation and reflexes    throughout          Assessment & Plan:

## 2017-04-16 NOTE — Patient Instructions (Signed)
Follow up in 1 year or as needed We'll notify you of your lab results and make any changes if needed Continue to work on healthy diet and regular exercise- you can do it!!! Call with any questions or concerns Have a great summer!!  

## 2017-04-16 NOTE — Assessment & Plan Note (Signed)
Pt's PE WNL.  UTD on pap, Tdap.  Stressed need for healthy diet and regular exercise.  Check labs.  Anticipatory guidance provided.

## 2017-04-17 NOTE — Progress Notes (Signed)
Called pt and lmovm to return call.

## 2017-04-18 ENCOUNTER — Other Ambulatory Visit: Payer: Self-pay | Admitting: General Practice

## 2017-04-18 MED ORDER — VITAMIN D (ERGOCALCIFEROL) 1.25 MG (50000 UNIT) PO CAPS: 50000.0000 [IU] | ORAL_CAPSULE | ORAL | 0 refills | Status: DC

## 2017-05-23 DIAGNOSIS — D225 Melanocytic nevi of trunk: Secondary | ICD-10-CM | POA: Diagnosis not present

## 2017-05-23 DIAGNOSIS — L821 Other seborrheic keratosis: Secondary | ICD-10-CM | POA: Diagnosis not present

## 2017-05-23 DIAGNOSIS — C44519 Basal cell carcinoma of skin of other part of trunk: Secondary | ICD-10-CM | POA: Diagnosis not present

## 2017-05-23 DIAGNOSIS — D2262 Melanocytic nevi of left upper limb, including shoulder: Secondary | ICD-10-CM | POA: Diagnosis not present

## 2017-05-23 DIAGNOSIS — D224 Melanocytic nevi of scalp and neck: Secondary | ICD-10-CM | POA: Diagnosis not present

## 2017-05-23 DIAGNOSIS — D2261 Melanocytic nevi of right upper limb, including shoulder: Secondary | ICD-10-CM | POA: Diagnosis not present

## 2017-05-23 DIAGNOSIS — Z85828 Personal history of other malignant neoplasm of skin: Secondary | ICD-10-CM | POA: Diagnosis not present

## 2017-05-23 DIAGNOSIS — D485 Neoplasm of uncertain behavior of skin: Secondary | ICD-10-CM | POA: Diagnosis not present

## 2017-05-23 DIAGNOSIS — R52 Pain, unspecified: Secondary | ICD-10-CM | POA: Diagnosis not present

## 2017-06-28 ENCOUNTER — Other Ambulatory Visit: Payer: Self-pay | Admitting: Family Medicine

## 2017-06-28 NOTE — Telephone Encounter (Signed)
Last OV 01/23/17 (cody martin) Alprazolam last filled 04/01/17 #30 with 0

## 2017-06-28 NOTE — Telephone Encounter (Signed)
Medication filled to pharmacy as requested.   

## 2017-07-08 ENCOUNTER — Other Ambulatory Visit: Payer: Self-pay | Admitting: Family Medicine

## 2017-07-29 ENCOUNTER — Ambulatory Visit (INDEPENDENT_AMBULATORY_CARE_PROVIDER_SITE_OTHER): Payer: BLUE CROSS/BLUE SHIELD | Admitting: Family Medicine

## 2017-07-29 ENCOUNTER — Encounter: Payer: Self-pay | Admitting: Family Medicine

## 2017-07-29 ENCOUNTER — Encounter: Payer: Self-pay | Admitting: General Practice

## 2017-07-29 VITALS — BP 118/76 | HR 80 | Temp 99.0°F | Resp 16 | Ht 64.0 in | Wt 158.1 lb

## 2017-07-29 DIAGNOSIS — L03211 Cellulitis of face: Secondary | ICD-10-CM | POA: Diagnosis not present

## 2017-07-29 DIAGNOSIS — Z79899 Other long term (current) drug therapy: Secondary | ICD-10-CM

## 2017-07-29 DIAGNOSIS — H00015 Hordeolum externum left lower eyelid: Secondary | ICD-10-CM

## 2017-07-29 MED ORDER — CEPHALEXIN 500 MG PO CAPS: 500.0000 mg | ORAL_CAPSULE | Freq: Two times a day (BID) | ORAL | 0 refills | Status: AC

## 2017-07-29 NOTE — Progress Notes (Signed)
Pre visit review using our clinic review tool, if applicable. No additional management support is needed unless otherwise documented below in the visit note. 

## 2017-07-29 NOTE — Progress Notes (Signed)
   Subjective:    Patient ID: Cassandra Gross, female    DOB: 1976-06-07, 41 y.o.   MRN: [id number]  HPI Eye infxn- pt reports her L eye was 'sore when I blinked' along her lower lash line.  Saturday afternoon sxs improved but yesterday when she woke up the L side of face was swollen.  Pt reports L cheek was TTP.  No fevers.     Review of Systems For ROS see HPI     Objective:   Physical Exam  Constitutional: She is oriented to person, place, and time. She appears well-developed and well-nourished. No distress.  HENT:  Head: Normocephalic and atraumatic.  Eyes: Pupils are equal, round, and reactive to light. Conjunctivae and EOM are normal. Right eye exhibits no discharge. Left eye exhibits no discharge.  Sty forming at lateral margin of L lower lid w/ associated swelling of the L inferior periorbital space (bags under the eye)  Neurological: She is alert and oriented to person, place, and time.  Psychiatric: She has a normal mood and affect. Her behavior is normal.  Vitals reviewed.         Assessment & Plan:  1) Stye- suspect this is the cause for her L facial swelling and TTP.  Hot compresses.  Due to the chance of this being early facial cellulitis, will start oral abx.  Pt has low risk PCN allergy so will try Keflex x7 days.  Reviewed supportive care and red flags that should prompt return.  Pt expressed understanding and is in agreement w/ plan.   2) controlled substance agreement- pt's form updated and UDS collected

## 2017-07-29 NOTE — Patient Instructions (Signed)
Follow up by phone or MyChart in 2-3 days to let me know how things are going Start the Keflex twice daily x7 days- take w/ food Warm compresses to the face and eye for pain and swelling relief Continue ibuprofen regularly Call with any questions or concerns- particularly if not improving or worsening Hang in there!!!

## 2017-08-01 LAB — PAIN MGMT, PROFILE 8 W/CONF, U
6 Acetylmorphine: NEGATIVE ng/mL (ref <10)
Alcohol Metabolites: POSITIVE ng/mL — AB (ref <500)
Amphetamines: NEGATIVE ng/mL (ref <500)
BENZODIAZEPINES: NEGATIVE ng/mL (ref <100)
BUPRENORPHINE, URINE: NEGATIVE ng/mL (ref <5)
CREATININE: 113.4 mg/dL
Cocaine Metabolite: NEGATIVE ng/mL (ref <150)
ETHYL SULFATE (ETS): 38292 ng/mL — AB (ref <100)
Ethyl Glucuronide (ETG): 241851 ng/mL — ABNORMAL HIGH (ref <500)
MDMA: NEGATIVE ng/mL (ref <500)
Marijuana Metabolite: NEGATIVE ng/mL (ref <20)
OXIDANT: NEGATIVE ug/mL (ref <200)
Opiates: NEGATIVE ng/mL (ref <100)
Oxycodone: NEGATIVE ng/mL (ref <100)
PH: 5.31 (ref 4.5–9.0)

## 2017-08-26 DIAGNOSIS — Z01419 Encounter for gynecological examination (general) (routine) without abnormal findings: Secondary | ICD-10-CM | POA: Diagnosis not present

## 2017-08-26 DIAGNOSIS — R82998 Other abnormal findings in urine: Secondary | ICD-10-CM | POA: Diagnosis not present

## 2017-08-26 DIAGNOSIS — Z3009 Encounter for other general counseling and advice on contraception: Secondary | ICD-10-CM | POA: Diagnosis not present

## 2017-08-26 DIAGNOSIS — Z1389 Encounter for screening for other disorder: Secondary | ICD-10-CM | POA: Diagnosis not present

## 2017-08-26 DIAGNOSIS — A6004 Herpesviral vulvovaginitis: Secondary | ICD-10-CM | POA: Diagnosis not present

## 2017-08-26 DIAGNOSIS — Z130 Encounter for screening for diseases of the blood and blood-forming organs and certain disorders involving the immune mechanism: Secondary | ICD-10-CM | POA: Diagnosis not present

## 2017-08-26 DIAGNOSIS — Z6828 Body mass index (BMI) 28.0-28.9, adult: Secondary | ICD-10-CM | POA: Diagnosis not present

## 2017-08-27 DIAGNOSIS — R82998 Other abnormal findings in urine: Secondary | ICD-10-CM | POA: Diagnosis not present

## 2017-09-05 ENCOUNTER — Other Ambulatory Visit: Payer: Self-pay | Admitting: Family Medicine

## 2017-09-05 NOTE — Telephone Encounter (Signed)
Last OV 04/16/17 Alprazolam last filled 06/28/17 #30 with 0

## 2017-09-05 NOTE — Telephone Encounter (Signed)
Medication filled to pharmacy as requested.   

## 2017-12-03 ENCOUNTER — Other Ambulatory Visit: Payer: Self-pay | Admitting: Physician Assistant

## 2017-12-03 DIAGNOSIS — J069 Acute upper respiratory infection, unspecified: Secondary | ICD-10-CM

## 2017-12-03 DIAGNOSIS — B9789 Other viral agents as the cause of diseases classified elsewhere: Principal | ICD-10-CM

## 2018-01-04 ENCOUNTER — Other Ambulatory Visit: Payer: Self-pay | Admitting: Family Medicine

## 2018-01-04 DIAGNOSIS — B9789 Other viral agents as the cause of diseases classified elsewhere: Principal | ICD-10-CM

## 2018-01-04 DIAGNOSIS — J069 Acute upper respiratory infection, unspecified: Secondary | ICD-10-CM

## 2018-01-28 ENCOUNTER — Other Ambulatory Visit: Payer: Self-pay | Admitting: Family Medicine

## 2018-01-28 NOTE — Telephone Encounter (Signed)
Last OV 07/29/17 (hordeolum), last CPE 03/2017, no upcoming appts Alprazolam last filled 09/05/17 #30 with 0

## 2018-01-29 NOTE — Telephone Encounter (Signed)
Needs to schedule upcoming CPE

## 2018-03-18 ENCOUNTER — Encounter: Payer: Self-pay | Admitting: Family Medicine

## 2018-03-19 ENCOUNTER — Ambulatory Visit: Payer: BLUE CROSS/BLUE SHIELD | Admitting: Family Medicine

## 2018-03-19 DIAGNOSIS — Z0289 Encounter for other administrative examinations: Secondary | ICD-10-CM

## 2018-04-02 ENCOUNTER — Encounter: Payer: Self-pay | Admitting: Family Medicine

## 2018-04-13 DIAGNOSIS — R05 Cough: Secondary | ICD-10-CM | POA: Diagnosis not present

## 2018-04-13 DIAGNOSIS — J019 Acute sinusitis, unspecified: Secondary | ICD-10-CM | POA: Diagnosis not present

## 2018-04-21 ENCOUNTER — Other Ambulatory Visit: Payer: Self-pay

## 2018-04-21 ENCOUNTER — Encounter: Payer: Self-pay | Admitting: Physician Assistant

## 2018-04-21 ENCOUNTER — Ambulatory Visit (INDEPENDENT_AMBULATORY_CARE_PROVIDER_SITE_OTHER): Payer: BLUE CROSS/BLUE SHIELD | Admitting: Physician Assistant

## 2018-04-21 VITALS — BP 108/78 | HR 77 | Temp 97.9°F | Resp 14 | Ht 64.0 in | Wt 149.0 lb

## 2018-04-21 DIAGNOSIS — F419 Anxiety disorder, unspecified: Secondary | ICD-10-CM | POA: Diagnosis not present

## 2018-04-21 DIAGNOSIS — F329 Major depressive disorder, single episode, unspecified: Secondary | ICD-10-CM

## 2018-04-21 DIAGNOSIS — Z0000 Encounter for general adult medical examination without abnormal findings: Secondary | ICD-10-CM | POA: Diagnosis not present

## 2018-04-21 DIAGNOSIS — F32A Depression, unspecified: Secondary | ICD-10-CM

## 2018-04-21 LAB — CBC WITH DIFFERENTIAL/PLATELET
Basophils Absolute: 0 10*3/uL (ref 0.0–0.1)
Basophils Relative: 0.6 % (ref 0.0–3.0)
EOS ABS: 0.2 10*3/uL (ref 0.0–0.7)
EOS PCT: 2.6 % (ref 0.0–5.0)
HCT: 42.2 % (ref 36.0–46.0)
HEMOGLOBIN: 14.5 g/dL (ref 12.0–15.0)
Lymphocytes Relative: 39.2 % (ref 12.0–46.0)
Lymphs Abs: 3 10*3/uL (ref 0.7–4.0)
MCHC: 34.3 g/dL (ref 30.0–36.0)
MCV: 96.6 fl (ref 78.0–100.0)
MONO ABS: 0.6 10*3/uL (ref 0.1–1.0)
Monocytes Relative: 7.8 % (ref 3.0–12.0)
Neutro Abs: 3.8 10*3/uL (ref 1.4–7.7)
Neutrophils Relative %: 49.8 % (ref 43.0–77.0)
Platelets: 286 10*3/uL (ref 150.0–400.0)
RBC: 4.37 Mil/uL (ref 3.87–5.11)
RDW: 13.5 % (ref 11.5–15.5)
WBC: 7.7 10*3/uL (ref 4.0–10.5)

## 2018-04-21 LAB — COMPREHENSIVE METABOLIC PANEL
ALBUMIN: 4.1 g/dL (ref 3.5–5.2)
ALK PHOS: 55 U/L (ref 39–117)
ALT: 33 U/L (ref 0–35)
AST: 20 U/L (ref 0–37)
BILIRUBIN TOTAL: 0.4 mg/dL (ref 0.2–1.2)
BUN: 8 mg/dL (ref 6–23)
CO2: 30 mEq/L (ref 19–32)
CREATININE: 0.71 mg/dL (ref 0.40–1.20)
Calcium: 8.6 mg/dL (ref 8.4–10.5)
Chloride: 100 mEq/L (ref 96–112)
GFR: 97.15 mL/min (ref >60.00)
Glucose, Bld: 89 mg/dL (ref 70–99)
Potassium: 3.7 mEq/L (ref 3.5–5.1)
SODIUM: 138 meq/L (ref 135–145)
TOTAL PROTEIN: 6.5 g/dL (ref 6.0–8.3)

## 2018-04-21 LAB — LIPID PANEL
CHOLESTEROL: 158 mg/dL (ref 0–200)
HDL: 49.3 mg/dL (ref >39.00)
LDL CALC: 88 mg/dL (ref 0–99)
NonHDL: 108.9
Total CHOL/HDL Ratio: 3
Triglycerides: 106 mg/dL (ref 0.0–149.0)
VLDL: 21.2 mg/dL (ref 0.0–40.0)

## 2018-04-21 LAB — HEMOGLOBIN A1C: HEMOGLOBIN A1C: 5.7 % (ref 4.6–6.5)

## 2018-04-21 MED ORDER — ALBUTEROL SULFATE HFA 108 (90 BASE) MCG/ACT IN AERS: 2.0000 | INHALATION_SPRAY | Freq: Four times a day (QID) | RESPIRATORY_TRACT | 3 refills | Status: DC | PRN

## 2018-04-21 NOTE — Progress Notes (Signed)
Patient presents to clinic today for annual exam.  Patient is fasting for labs.   Diet -- Is watching diet and trying to keep smaller portion sizes. Notes in work in progress.  Exercise -- Schedule is a big issue with this. Does walk quite a bit during the day.   Health Maintenance: Immunizations -- up-to-date.  PAP -- up-to-date. Followed by GYN.  Past Medical History:  Diagnosis Date  . Anxiety   . Asthma   . HSV (herpes simplex virus) anogenital infection     Past Surgical History:  Procedure Laterality Date  . WISDOM TOOTH EXTRACTION      Current Outpatient Medications on File Prior to Visit  Medication Sig Dispense Refill  . albuterol (PROVENTIL HFA;VENTOLIN HFA) 108 (90 BASE) MCG/ACT inhaler Inhale 2 puffs into the lungs every 6 (six) hours as needed for wheezing or shortness of breath. 6.7 g 3  . ALPRAZolam (XANAX) 0.5 MG tablet TAKE 1 TABLET BY MOUTH AT BEDTIME AS NEEDED FOR ANXIETY 30 tablet 0  . Etonogestrel (IMPLANON [place]) Inject into the skin.    Marland Kitchen FLUoxetine (PROZAC) 20 MG tablet TAKE 1 TABLET EVERY DAY 30 tablet 6  . Probiotic Product (PROBIOTIC DAILY PO) Take by mouth.    . SYMBICORT 80-4.5 MCG/ACT inhaler INHALE 2 PUFFS INTO THE LUNGS 2 (TWO) TIMES DAILY AS NEEDED. 10.2 Inhaler 1   No current facility-administered medications on file prior to visit.     Allergies  Allergen Reactions  . Shellfish Allergy Other (See Comments)    Skin test   . Penicillins Rash    infant    Family History  Problem Relation Age of Onset  . Asthma Mother   . Hyperlipidemia Father   . Hypertension Father   . Asthma Maternal Grandfather     Social History   Socioeconomic History  . Marital status: Married    Spouse name: Not on file  . Number of children: Not on file  . Years of education: Not on file  . Highest education level: Not on file  Occupational History  . Not on file  Social Needs  . Financial resource strain: Not on file  . Food insecurity:   Worry: Not on file    Inability: Not on file  . Transportation needs:    Medical: Not on file    Non-medical: Not on file  Tobacco Use  . Smoking status: Never Smoker  . Smokeless tobacco: Never Used  Substance and Sexual Activity  . Alcohol use: No  . Drug use: No  . Sexual activity: Yes  Lifestyle  . Physical activity:    Days per week: Not on file    Minutes per session: Not on file  . Stress: Not on file  Relationships  . Social connections:    Talks on phone: Not on file    Gets together: Not on file    Attends religious service: Not on file    Active member of club or organization: Not on file    Attends meetings of clubs or organizations: Not on file    Relationship status: Not on file  . Intimate partner violence:    Fear of current or ex partner: Not on file    Emotionally abused: Not on file    Physically abused: Not on file    Forced sexual activity: Not on file  Other Topics Concern  . Not on file  Social History Narrative  . Not on file  Review of Systems  Constitutional: Negative for fever and weight loss.  HENT: Negative for ear discharge, ear pain, hearing loss and tinnitus.   Eyes: Negative for blurred vision, double vision, photophobia and pain.  Respiratory: Negative for cough and shortness of breath.   Cardiovascular: Negative for chest pain and palpitations.  Gastrointestinal: Negative for abdominal pain, blood in stool, constipation, diarrhea, heartburn, melena, nausea and vomiting.  Genitourinary: Negative for dysuria, flank pain, frequency, hematuria and urgency.  Musculoskeletal: Negative for falls.  Neurological: Negative for dizziness, loss of consciousness and headaches.  Endo/Heme/Allergies: Negative for environmental allergies.  Psychiatric/Behavioral: Negative for depression, hallucinations, substance abuse and suicidal ideas. The patient is not nervous/anxious and does not have insomnia.    BP 108/78   Pulse 77   Temp 97.9 F (36.6  C) (Oral)   Resp 14   Ht 5\' 4"  (1.626 m)   Wt 149 lb (67.6 kg)   SpO2 99%   BMI 25.58 kg/m   Physical Exam  Constitutional: She is oriented to person, place, and time.  HENT:  Head: Normocephalic and atraumatic.  Right Ear: Tympanic membrane, external ear and ear canal normal.  Left Ear: Tympanic membrane, external ear and ear canal normal.  Nose: Nose normal. No mucosal edema.  Mouth/Throat: Uvula is midline, oropharynx is clear and moist and mucous membranes are normal. No oropharyngeal exudate or posterior oropharyngeal erythema.  Eyes: Pupils are equal, round, and reactive to light. Conjunctivae are normal.  Neck: Neck supple. No thyromegaly present.  Cardiovascular: Normal rate, regular rhythm, normal heart sounds and intact distal pulses.  Pulmonary/Chest: Effort normal and breath sounds normal. No respiratory distress. She has no wheezes. She has no rales.  Abdominal: Soft. Bowel sounds are normal. She exhibits no distension and no mass. There is no tenderness. There is no rebound and no guarding.  Lymphadenopathy:    She has no cervical adenopathy.  Neurological: She is alert and oriented to person, place, and time. No cranial nerve deficit.  Skin: Skin is warm and dry. No rash noted.  Vitals reviewed.  Assessment/Plan: 1. Routine general medical examination at a health care facility Depression screen negative. Health Maintenance reviewed -- PAP up-to-date. Followed by GYN. Immunizations are up-to-date. Preventive schedule discussed and handout given in AVS. Will obtain fasting labs today.  - CBC with Differential/Platelet - Comprehensive metabolic panel - Lipid panel - Hemoglobin A1c  2. Anxiety and depression Taking medications as directed. Rare use of Xanax. Mood stable. Continue current regimen. Follow-up 6 months.    Leeanne Rio, PA-C

## 2018-04-21 NOTE — Patient Instructions (Signed)
Please go to the lab for blood work.   Our office will call you with your results unless you have chosen to receive results via MyChart.  If your blood work is normal we will follow-up each year for physicals and as scheduled for chronic medical problems.  If anything is abnormal we will treat accordingly and get you in for a follow-up.   Preventive Care 18-39 Years, Female Preventive care refers to lifestyle choices and visits with your health care provider that can promote health and wellness. What does preventive care include?  A yearly physical exam. This is also called an annual well check.  Dental exams once or twice a year.  Routine eye exams. Ask your health care provider how often you should have your eyes checked.  Personal lifestyle choices, including: ? Daily care of your teeth and gums. ? Regular physical activity. ? Eating a healthy diet. ? Avoiding tobacco and drug use. ? Limiting alcohol use. ? Practicing safe sex. ? Taking vitamin and mineral supplements as recommended by your health care provider. What happens during an annual well check? The services and screenings done by your health care provider during your annual well check will depend on your age, overall health, lifestyle risk factors, and family history of disease. Counseling Your health care provider may ask you questions about your:  Alcohol use.  Tobacco use.  Drug use.  Emotional well-being.  Home and relationship well-being.  Sexual activity.  Eating habits.  Work and work Statistician.  Method of birth control.  Menstrual cycle.  Pregnancy history.  Screening You may have the following tests or measurements:  Height, weight, and BMI.  Diabetes screening. This is done by checking your blood sugar (glucose) after you have not eaten for a while (fasting).  Blood pressure.  Lipid and cholesterol levels. These may be checked every 5 years starting at age 55.  Skin  check.  Hepatitis C blood test.  Hepatitis B blood test.  Sexually transmitted disease (STD) testing.  BRCA-related cancer screening. This may be done if you have a family history of breast, ovarian, tubal, or peritoneal cancers.  Pelvic exam and Pap test. This may be done every 3 years starting at age 46. Starting at age 12, this may be done every 5 years if you have a Pap test in combination with an HPV test.  Discuss your test results, treatment options, and if necessary, the need for more tests with your health care provider. Vaccines Your health care provider may recommend certain vaccines, such as:  Influenza vaccine. This is recommended every year.  Tetanus, diphtheria, and acellular pertussis (Tdap, Td) vaccine. You may need a Td booster every 10 years.  Varicella vaccine. You may need this if you have not been vaccinated.  HPV vaccine. If you are 39 or younger, you may need three doses over 6 months.  Measles, mumps, and rubella (MMR) vaccine. You may need at least one dose of MMR. You may also need a second dose.  Pneumococcal 13-valent conjugate (PCV13) vaccine. You may need this if you have certain conditions and were not previously vaccinated.  Pneumococcal polysaccharide (PPSV23) vaccine. You may need one or two doses if you smoke cigarettes or if you have certain conditions.  Meningococcal vaccine. One dose is recommended if you are age 13-21 years and a first-year college student living in a residence hall, or if you have one of several medical conditions. You may also need additional booster doses.  Hepatitis A vaccine.  need this if you have certain conditions or if you travel or work in places where you may be exposed to hepatitis A.  Hepatitis B vaccine. You may need this if you have certain conditions or if you travel or work in places where you may be exposed to hepatitis B.  Haemophilus influenzae type b (Hib) vaccine. You may need this if you have  certain risk factors.  Talk to your health care provider about which screenings and vaccines you need and how often you need them. This information is not intended to replace advice given to you by your health care provider. Make sure you discuss any questions you have with your health care provider. Document Released: 12/11/2001 Document Revised: 07/04/2016 Document Reviewed: 08/16/2015 Elsevier Interactive Patient Education  2018 [employer]  

## 2018-08-01 ENCOUNTER — Other Ambulatory Visit: Payer: Self-pay | Admitting: Family Medicine

## 2018-08-01 NOTE — Telephone Encounter (Signed)
Last OV 04/21/18 (CPE with Cody) Alprazolam last filled 01/29/18 #30 with 0

## 2018-08-25 DIAGNOSIS — Z23 Encounter for immunization: Secondary | ICD-10-CM | POA: Diagnosis not present

## 2018-09-02 DIAGNOSIS — D1801 Hemangioma of skin and subcutaneous tissue: Secondary | ICD-10-CM | POA: Diagnosis not present

## 2018-09-02 DIAGNOSIS — L720 Epidermal cyst: Secondary | ICD-10-CM | POA: Diagnosis not present

## 2018-09-02 DIAGNOSIS — D485 Neoplasm of uncertain behavior of skin: Secondary | ICD-10-CM | POA: Diagnosis not present

## 2018-09-02 DIAGNOSIS — D225 Melanocytic nevi of trunk: Secondary | ICD-10-CM | POA: Diagnosis not present

## 2018-09-12 ENCOUNTER — Other Ambulatory Visit: Payer: Self-pay

## 2018-09-12 ENCOUNTER — Ambulatory Visit: Payer: BLUE CROSS/BLUE SHIELD | Admitting: Family Medicine

## 2018-09-12 ENCOUNTER — Encounter: Payer: Self-pay | Admitting: Family Medicine

## 2018-09-12 VITALS — BP 104/68 | HR 85 | Temp 98.8°F | Ht 64.0 in | Wt 153.4 lb

## 2018-09-12 DIAGNOSIS — J069 Acute upper respiratory infection, unspecified: Secondary | ICD-10-CM | POA: Diagnosis not present

## 2018-09-12 DIAGNOSIS — B9789 Other viral agents as the cause of diseases classified elsewhere: Secondary | ICD-10-CM

## 2018-09-12 DIAGNOSIS — J029 Acute pharyngitis, unspecified: Secondary | ICD-10-CM | POA: Diagnosis not present

## 2018-09-12 LAB — POCT RAPID STREP A (OFFICE): RAPID STREP A SCREEN: NEGATIVE

## 2018-09-12 MED ORDER — BENZONATATE 100 MG PO CAPS: 100.0000 mg | ORAL_CAPSULE | Freq: Two times a day (BID) | ORAL | 0 refills | Status: DC | PRN

## 2018-09-12 NOTE — Patient Instructions (Addendum)
Please follow up if symptoms do not improve or as needed.   You may use Delsym cough syrup or Mucinex DM to help with congestion and coughing. OR dayquil and nyquil. I have sent a prescription for tessalon perles that can be used during the day to relieve cough spasms.    Viral Illness, Adult Viruses are tiny germs that can get into a person's body and cause illness. There are many different types of viruses, and they cause many types of illness. Viral illnesses can range from mild to severe. They can affect various parts of the body. Common illnesses that are caused by a virus include colds and the flu. Viral illnesses also include serious conditions such as HIV/AIDS (human immunodeficiency virus/acquired immunodeficiency syndrome). A few viruses have been linked to certain cancers. What are the causes? Many types of viruses can cause illness. Viruses invade cells in your body, multiply, and cause the infected cells to malfunction or die. When the cell dies, it releases more of the virus. When this happens, you develop symptoms of the illness, and the virus continues to spread to other cells. If the virus takes over the function of the cell, it can cause the cell to divide and grow out of control, as is the case when a virus causes cancer. Different viruses get into the body in different ways. You can get a virus by:  Swallowing food or water that is contaminated with the virus.  Breathing in droplets that have been coughed or sneezed into the air by an infected person.  Touching a surface that has been contaminated with the virus and then touching your eyes, nose, or mouth.  Being bitten by an insect or animal that carries the virus.  Having sexual contact with a person who is infected with the virus.  Being exposed to blood or fluids that contain the virus, either through an open cut or during a transfusion.  If a virus enters your body, your body's defense system (immune system) will  try to fight the virus. You may be at higher risk for a viral illness if your immune system is weak. What are the signs or symptoms? Symptoms vary depending on the type of virus and the location of the cells that it invades. Common symptoms of the main types of viral illnesses include: Cold and flu viruses  Fever.  Headache.  Sore throat.  Muscle aches.  Nasal congestion.  Cough. Digestive system (gastrointestinal) viruses  Fever.  Abdominal pain.  Nausea.  Diarrhea. Liver viruses (hepatitis)  Loss of appetite.  Tiredness.  Yellowing of the skin (jaundice). Brain and spinal cord viruses  Fever.  Headache.  Stiff neck.  Nausea and vomiting.  Confusion or sleepiness. Skin viruses  Warts.  Itching.  Rash. Sexually transmitted viruses  Discharge.  Swelling.  Redness.  Rash. How is this treated? Viruses can be difficult to treat because they live within cells. Antibiotic medicines do not treat viruses because these drugs do not get inside cells. Treatment for a viral illness may include:  Resting and drinking plenty of fluids.  Medicines to relieve symptoms. These can include over-the-counter medicine for pain and fever, medicines for cough or congestion, and medicines to relieve diarrhea.  Antiviral medicines. These drugs are available only for certain types of viruses. They may help reduce flu symptoms if taken early. There are also many antiviral medicines for hepatitis and HIV/AIDS.  Some viral illnesses can be prevented with vaccinations. A common example is the flu shot.  Follow these instructions at home: Medicines   Take over-the-counter and prescription medicines only as told by your health care provider.  If you were prescribed an antiviral medicine, take it as told by your health care provider. Do not stop taking the medicine even if you start to feel better.  Be aware of when antibiotics are needed and when they are not needed.  Antibiotics do not treat viruses. If your health care provider thinks that you may have a bacterial infection as well as a viral infection, you may get an antibiotic. ? Do not ask for an antibiotic prescription if you have been diagnosed with a viral illness. That will not make your illness go away faster. ? Frequently taking antibiotics when they are not needed can lead to antibiotic resistance. When this develops, the medicine no longer works against the bacteria that it normally fights. General instructions  Drink enough fluids to keep your urine clear or pale yellow.  Rest as much as possible.  Return to your normal activities as told by your health care provider. Ask your health care provider what activities are safe for you.  Keep all follow-up visits as told by your health care provider. This is important. How is this prevented? Take these actions to reduce your risk of viral infection:  Eat a healthy diet and get enough rest.  Wash your hands often with soap and water. This is especially important when you are in public places. If soap and water are not available, use hand sanitizer.  Avoid close contact with friends and family who have a viral illness.  If you travel to areas where viral gastrointestinal infection is common, avoid drinking water or eating raw food.  Keep your immunizations up to date. Get a flu shot every year as told by your health care provider.  Do not share toothbrushes, nail clippers, razors, or needles with other people.  Always practice safe sex.  Contact a health care provider if:  You have symptoms of a viral illness that do not go away.  Your symptoms come back after going away.  Your symptoms get worse. Get help right away if:  You have trouble breathing.  You have a severe headache or a stiff neck.  You have severe vomiting or abdominal pain. This information is not intended to replace advice given to you by your health care provider.  Make sure you discuss any questions you have with your health care provider. Document Released: 02/24/2016 Document Revised: 03/28/2016 Document Reviewed: 02/24/2016 Elsevier Interactive Patient Education  [employer].

## 2018-09-12 NOTE — Progress Notes (Signed)
Subjective   CC:  Chief Complaint  Patient presents with  . Sore Throat    bi-lateral ear pain, sinus pain and pressure    HPI: Cassandra Gross is a 42 y.o. female who presents to the office today to address the problems listed above in the chief complaint.  Patient complains of typical URI symptoms including nasal congestion, sore throat, cough, and mild malaise with low grade fever and malaise.  The symptoms have been present for 5 days. She denies high fever or productive cough, shortness of breath or significant GI symptoms.  Using advil and cough drops with minimal relief.  Denies sinus pain. 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   Assessment  1. Viral URI with cough   2. Sore throat      Plan   URI, viral: discussed dx; no sign or sx of bacterial infection is present. Treat supportively with antihistamines, decongestants, and/or cough meds. See AVS for care instructions.   Follow up: Return if symptoms worsen or fail to improve.   Orders Placed This Encounter  Procedures  . POCT rapid strep A   Meds ordered this encounter  Medications  . benzonatate (TESSALON) 100 MG capsule    Sig: Take 1 capsule (100 mg total) by mouth 2 (two) times daily as needed for cough.    Dispense:  20 capsule    Refill:  0      I reviewed the patients updated PMH, FH, and SocHx.    Patient Active Problem List   Diagnosis Date Noted  . Non-suppurative otitis media 10/13/2014  . Anxiety and depression 08/12/2014  . Routine general medical examination at a health care facility 03/01/2014  . Asthma, mild persistent 03/01/2014   Current Meds  Medication Sig  . cetirizine (ZYRTEC) 10 MG tablet Take 10 mg by mouth daily.  . Etonogestrel (IMPLANON [place]) Inject into the skin.  Marland Kitchen FLUoxetine (PROZAC) 20 MG tablet TAKE 1 TABLET EVERY DAY  . Probiotic Product (PROBIOTIC DAILY PO) Take by mouth.  . SYMBICORT 80-4.5 MCG/ACT inhaler INHALE 2 PUFFS INTO THE LUNGS  2 (TWO) TIMES DAILY AS NEEDED.    Review of Systems: Constitutional: Negative for fever malaise or anorexia Cardiovascular: negative for chest pain Respiratory: negative for SOB or pleuritic chest pain Gastrointestinal: negative for abdominal pain  Objective  Vitals: BP 104/68   Pulse 85   Temp 98.8 F (37.1 C)   Ht 5\' 4"  (1.626 m)   Wt 153 lb 6.4 oz (69.6 kg)   SpO2 96%   BMI 26.33 kg/m  General: no acute respiratory distress  Psych:  Alert and oriented, normal mood and affect HEENT: Normocephalic, nasal congestion present, TMs w/o erythema, OP with erythema w/o exudate, + cervical LAD, supple neck  Cardiovascular:  RRR without murmur or gallop. no peripheral edema Respiratory:  Good breath sounds bilaterally, CTAB with normal respiratory effort Skin:  Warm, no rashes Neurologic:   Mental status is normal. normal gait  Commons side effects, risks, benefits, and alternatives for medications and treatment plan prescribed today were discussed, and the patient expressed understanding of the given instructions. Patient is instructed to call or message via MyChart if he/she has any questions or concerns regarding our treatment plan. No barriers to understanding were identified. We discussed Red Flag symptoms and signs in detail. Patient expressed understanding regarding what to do in case of urgent or emergency type symptoms.   Medication list was reconciled, printed and provided to  the patient in AVS. Patient instructions and summary information was reviewed with the patient as documented in the AVS. This note was prepared with assistance of Dragon voice recognition software. Occasional wrong-word or sound-a-like substitutions may have occurred due to the inherent limitations of voice recognition software

## 2018-09-29 DIAGNOSIS — Z1389 Encounter for screening for other disorder: Secondary | ICD-10-CM | POA: Diagnosis not present

## 2018-09-29 DIAGNOSIS — Z01419 Encounter for gynecological examination (general) (routine) without abnormal findings: Secondary | ICD-10-CM | POA: Diagnosis not present

## 2018-09-29 DIAGNOSIS — A6000 Herpesviral infection of urogenital system, unspecified: Secondary | ICD-10-CM | POA: Insufficient documentation

## 2018-09-29 DIAGNOSIS — Z6827 Body mass index (BMI) 27.0-27.9, adult: Secondary | ICD-10-CM | POA: Diagnosis not present

## 2018-09-29 DIAGNOSIS — Z130 Encounter for screening for diseases of the blood and blood-forming organs and certain disorders involving the immune mechanism: Secondary | ICD-10-CM | POA: Diagnosis not present

## 2018-10-01 ENCOUNTER — Other Ambulatory Visit: Payer: Self-pay | Admitting: Family Medicine

## 2018-10-01 MED ORDER — FLUOXETINE HCL 20 MG PO TABS: 20.0000 mg | ORAL_TABLET | Freq: Every day | ORAL | 1 refills | Status: DC

## 2018-10-01 NOTE — Telephone Encounter (Signed)
Copied from [id number] [id number]. Topic: Quick Communication - Rx Refill/Question >> Oct 01, 2018  9:33 AM Bea Graff, NT wrote: Medication: FLUoxetine (PROZAC) 20 MG tablet   Has the patient contacted their pharmacy? Yes.   (Agent: If no, request that the patient contact the pharmacy for the refill.) (Agent: If yes, when and what did the pharmacy advise?)  Preferred Pharmacy (with phone number or street name): [id number] - [place], [place] - [address] [phone] (Phone) [phone] (Fax)    Agent: Please be advised that RX refills may take up to 3 business days. We ask that you follow-up with your pharmacy.

## 2018-10-09 MED ORDER — FLUOXETINE HCL 20 MG PO TABS: 20.0000 mg | ORAL_TABLET | Freq: Every day | ORAL | 1 refills | Status: DC

## 2018-10-09 NOTE — Addendum Note (Signed)
Addended by: Matilde Sprang on: 10/09/2018 05:51 PM   Modules accepted: Orders

## 2018-10-09 NOTE — Telephone Encounter (Signed)
Patient would like to have her FLUoxetine (PROZAC) 20 MG tablet medication sent to [facility] in [place] on [address]. She does not plan on picking it up at the [facility] to.    In the future she would like to only have this medication sent to [facility].

## 2018-10-14 DIAGNOSIS — D485 Neoplasm of uncertain behavior of skin: Secondary | ICD-10-CM | POA: Diagnosis not present

## 2018-10-14 DIAGNOSIS — L988 Other specified disorders of the skin and subcutaneous tissue: Secondary | ICD-10-CM | POA: Diagnosis not present

## 2018-11-11 DIAGNOSIS — Z1231 Encounter for screening mammogram for malignant neoplasm of breast: Secondary | ICD-10-CM | POA: Diagnosis not present

## 2018-11-19 ENCOUNTER — Other Ambulatory Visit: Payer: Self-pay | Admitting: Obstetrics and Gynecology

## 2018-11-19 DIAGNOSIS — R928 Other abnormal and inconclusive findings on diagnostic imaging of breast: Secondary | ICD-10-CM

## 2018-11-28 ENCOUNTER — Ambulatory Visit
Admission: RE | Admit: 2018-11-28 | Discharge: 2018-11-28 | Disposition: A | Discharge status: Home / Self Care | Payer: BLUE CROSS/BLUE SHIELD | Source: Ambulatory Visit | Attending: Obstetrics and Gynecology | Admitting: Obstetrics and Gynecology

## 2018-11-28 ENCOUNTER — Ambulatory Visit: Payer: BLUE CROSS/BLUE SHIELD

## 2018-11-28 DIAGNOSIS — R922 Inconclusive mammogram: Secondary | ICD-10-CM | POA: Diagnosis not present

## 2018-11-28 DIAGNOSIS — R928 Other abnormal and inconclusive findings on diagnostic imaging of breast: Secondary | ICD-10-CM

## 2018-12-09 ENCOUNTER — Ambulatory Visit: Payer: BLUE CROSS/BLUE SHIELD | Admitting: Family Medicine

## 2018-12-09 ENCOUNTER — Encounter: Payer: Self-pay | Admitting: Family Medicine

## 2018-12-09 ENCOUNTER — Other Ambulatory Visit: Payer: Self-pay

## 2018-12-09 VITALS — BP 118/72 | HR 101 | Temp 99.4°F | Resp 16 | Ht 64.0 in | Wt 153.0 lb

## 2018-12-09 DIAGNOSIS — J101 Influenza due to other identified influenza virus with other respiratory manifestations: Secondary | ICD-10-CM | POA: Diagnosis not present

## 2018-12-09 DIAGNOSIS — R6889 Other general symptoms and signs: Secondary | ICD-10-CM

## 2018-12-09 LAB — POCT INFLUENZA A/B
Influenza A, POC: POSITIVE — AB
Influenza B, POC: NEGATIVE

## 2018-12-09 MED ORDER — OSELTAMIVIR PHOSPHATE 75 MG PO CAPS: 75.0000 mg | ORAL_CAPSULE | Freq: Two times a day (BID) | ORAL | 0 refills | Status: DC

## 2018-12-09 NOTE — Progress Notes (Signed)
Subjective   CC:  Chief Complaint  Patient presents with  . Flu like symptoms    Started Sunday, has taking IBU    HPI: Cassandra Gross is a 43 y.o. female who presents to the office today to address the problems listed above in the chief complaint.  Patient complains of flu like symptoms including myalgias, fever, ST, mild cough and some congestion. Sxs have been present for 2 days. She has tried to alleviate the sxs with over-the-counter medicines with mild relief. No high risk factors for influenza complications or high risk household contacts are present. No SOB or CP are present. Taking in fluids adequately; decreased appetite bt no significant n/v/d. She has not had the flu vaccine this season.   Assessment  1. Influenza A   2. Flu-like symptoms      Plan   Flu mgt education given. Push fluids, treat fevers, tamiflu and cough meds OTC as needed. Fu for worsening sxs or sob.   Follow up: Return if symptoms worsen or fail to improve.   Orders Placed This Encounter  Procedures  . POCT Influenza A/B   Meds ordered this encounter  Medications  . oseltamivir (TAMIFLU) 75 MG capsule    Sig: Take 1 capsule (75 mg total) by mouth 2 (two) times daily.    Dispense:  10 capsule    Refill:  0     I reviewed the patients updated PMH, FH, and SocHx.    Patient Active Problem List   Diagnosis Date Noted  . Non-suppurative otitis media 10/13/2014  . Anxiety and depression 08/12/2014  . Routine general medical examination at a health care facility 03/01/2014  . Asthma, mild persistent 03/01/2014   No outpatient medications have been marked as taking for the 12/09/18 encounter (Office Visit) with Leamon Arnt, MD.   Family History: Patient family history includes Asthma in her maternal grandfather and mother; Hyperlipidemia in her father; Hypertension in her father. Social History:  Patient  reports that she has never smoked. She has never used smokeless tobacco. She reports  that she does not drink alcohol or use drugs.  Review of Systems: Constitutional: negative for fever or malaise Ophthalmic: negative for photophobia, double vision or loss of vision Cardiovascular: negative for chest pain, dyspnea on exertion, or new LE swelling Respiratory: negative for SOB or persistent cough Gastrointestinal: negative for abdominal pain, change in bowel habits or melena Genitourinary: negative for dysuria or gross hematuria Musculoskeletal: negative for new gait disturbance or muscular weakness Integumentary: negative for new or persistent rashes Neurological: negative for TIA or stroke symptoms Psychiatric: negative for SI or delusions Allergic/Immunologic: negative for hives  Objective  Vitals: BP 118/72   Pulse (!) 101   Temp 99.4 F (37.4 C) (Oral)   Resp 16   Ht 5\' 4"  (1.626 m)   Wt 153 lb (69.4 kg)   SpO2 97%   BMI 26.26 kg/m  General: no acute respiratory distress , nontoxic appearing Psych:  Alert and oriented, normal mood and affect HEENT: Normocephalic, nasal congestion present, TMs w/o erythema, OP with erythema w/o exudate, no LAD, supple neck  Cardiovascular:  RRR without murmur or gallop. no peripheral edema Respiratory:  Good breath sounds bilaterally, CTAB with normal respiratory effort Gastrointestinal: soft, flat abdomen, normal active bowel sounds, no palpable masses, no hepatosplenomegaly, no appreciated hernias Skin:  Warm, no rashes,  clammy Neurologic:   Mental status is normal. normal gait  Office Visit on 12/09/2018  Component Date Value Ref  Range Status  . Influenza A, POC 12/09/2018 Positive* Negative Final  . Influenza B, POC 12/09/2018 Negative  Negative Final     Commons side effects, risks, benefits, and alternatives for medications and treatment plan prescribed today were discussed, and the patient expressed understanding of the given instructions. Patient is instructed to call or message via MyChart if he/she has any  questions or concerns regarding our treatment plan. No barriers to understanding were identified. We discussed Red Flag symptoms and signs in detail. Patient expressed understanding regarding what to do in case of urgent or emergency type symptoms.   Medication list was reconciled, printed and provided to the patient in AVS. Patient instructions and summary information was reviewed with the patient as documented in the AVS. This note was prepared with assistance of Dragon voice recognition software. Occasional wrong-word or sound-a-like substitutions may have occurred due to the inherent limitations of voice recognition software

## 2018-12-09 NOTE — Patient Instructions (Signed)
Please follow up if symptoms do not improve or as needed.    Influenza, Adult Influenza, more commonly known as "the flu," is a viral infection that mainly affects the respiratory tract. The respiratory tract includes organs that help you breathe, such as the lungs, nose, and throat. The flu causes many symptoms similar to the common cold along with high fever and body aches. The flu spreads easily from person to person (is contagious). Getting a flu shot (influenza vaccination) every year is the best way to prevent the flu. What are the causes? This condition is caused by the influenza virus. You can get the virus by:  Breathing in droplets that are in the air from an infected person's cough or sneeze.  Touching something that has been exposed to the virus (has been contaminated) and then touching your mouth, nose, or eyes. What increases the risk? The following factors may make you more likely to get the flu:  Not washing or sanitizing your hands often.  Having close contact with many people during cold and flu season.  Touching your mouth, eyes, or nose without first washing or sanitizing your hands.  Not getting a yearly (annual) flu shot. You may have a higher risk for the flu, including serious problems such as a lung infection (pneumonia), if you:  Are older than 65.  Are pregnant.  Have a weakened disease-fighting system (immune system). You may have a weakened immune system if you: ? Have HIV or AIDS. ? Are undergoing chemotherapy. ? Are taking medicines that reduce (suppress) the activity of your immune system.  Have a long-term (chronic) illness, such as heart disease, kidney disease, diabetes, or lung disease.  Have a liver disorder.  Are severely overweight (morbidly obese).  Have anemia. This is a condition that affects your red blood cells.  Have asthma. What are the signs or symptoms? Symptoms of this condition usually begin suddenly and last 4-14 days. They  may include:  Fever and chills.  Headaches, body aches, or muscle aches.  Sore throat.  Cough.  Runny or stuffy (congested) nose.  Chest discomfort.  Poor appetite.  Weakness or fatigue.  Dizziness.  Nausea or vomiting. How is this diagnosed? This condition may be diagnosed based on:  Your symptoms and medical history.  A physical exam.  Swabbing your nose or throat and testing the fluid for the influenza virus. How is this treated? If the flu is diagnosed early, you can be treated with medicine that can help reduce how severe the illness is and how long it lasts (antiviral medicine). This may be given by mouth (orally) or through an IV. Taking care of yourself at home can help relieve symptoms. Your health care provider may recommend:  Taking over-the-counter medicines.  Drinking plenty of fluids. In many cases, the flu goes away on its own. If you have severe symptoms or complications, you may be treated in a hospital. Follow these instructions at home: Activity  Rest as needed and get plenty of sleep.  Stay home from work or school as told by your health care provider. Unless you are visiting your health care provider, avoid leaving home until your fever has been gone for 24 hours without taking medicine. Eating and drinking  Take an oral rehydration solution (ORS). This is a drink that is sold at pharmacies and retail stores.  Drink enough fluid to keep your urine pale yellow.  Drink clear fluids in small amounts as you are able. Clear fluids include water,  ice chips, diluted fruit juice, and low-calorie sports drinks.  Eat bland, easy-to-digest foods in small amounts as you are able. These foods include bananas, applesauce, rice, lean meats, toast, and crackers.  Avoid drinking fluids that contain a lot of sugar or caffeine, such as energy drinks, regular sports drinks, and soda.  Avoid alcohol.  Avoid spicy or fatty foods. General instructions       Take over-the-counter and prescription medicines only as told by your health care provider.  Use a cool mist humidifier to add humidity to the air in your home. This can make it easier to breathe.  Cover your mouth and nose when you cough or sneeze.  Wash your hands with soap and water often, especially after you cough or sneeze. If soap and water are not available, use alcohol-based hand sanitizer.  Keep all follow-up visits as told by your health care provider. This is important. How is this prevented?   Get an annual flu shot. You may get the flu shot in late summer, fall, or winter. Ask your health care provider when you should get your flu shot.  Avoid contact with people who are sick during cold and flu season. This is generally fall and winter. Contact a health care provider if:  You develop new symptoms.  You have: ? Chest pain. ? Diarrhea. ? A fever.  Your cough gets worse.  You produce more mucus.  You feel nauseous or you vomit. Get help right away if:  You develop shortness of breath or difficulty breathing.  Your skin or nails turn a bluish color.  You have severe pain or stiffness in your neck.  You develop a sudden headache or sudden pain in your face or ear.  You cannot eat or drink without vomiting. Summary  Influenza, more commonly known as "the flu," is a viral infection that primarily affects your respiratory tract.  Symptoms of the flu usually begin suddenly and last 4-14 days.  Getting an annual flu shot is the best way to prevent getting the flu.  Stay home from work or school as told by your health care provider. Unless you are visiting your health care provider, avoid leaving home until your fever has been gone for 24 hours without taking medicine.  Keep all follow-up visits as told by your health care provider. This is important. This information is not intended to replace advice given to you by your health care provider. Make sure  you discuss any questions you have with your health care provider. Document Released: 10/12/2000 Document Revised: 04/02/2018 Document Reviewed: 04/02/2018 Elsevier Interactive Patient Education  2019 [employer].

## 2018-12-19 ENCOUNTER — Encounter: Payer: Self-pay | Admitting: Family Medicine

## 2018-12-19 MED ORDER — BUDESONIDE-FORMOTEROL FUMARATE 80-4.5 MCG/ACT IN AERO: 2.0000 | INHALATION_SPRAY | Freq: Two times a day (BID) | RESPIRATORY_TRACT | 0 refills | Status: DC | PRN

## 2019-01-09 DIAGNOSIS — Z30017 Encounter for initial prescription of implantable subdermal contraceptive: Secondary | ICD-10-CM | POA: Diagnosis not present

## 2019-03-09 DIAGNOSIS — Z3046 Encounter for surveillance of implantable subdermal contraceptive: Secondary | ICD-10-CM | POA: Diagnosis not present

## 2019-03-31 ENCOUNTER — Encounter: Payer: Self-pay | Admitting: Family Medicine

## 2019-08-12 ENCOUNTER — Other Ambulatory Visit: Payer: Self-pay

## 2019-08-12 ENCOUNTER — Encounter: Payer: Self-pay | Admitting: Physician Assistant

## 2019-08-12 ENCOUNTER — Ambulatory Visit (INDEPENDENT_AMBULATORY_CARE_PROVIDER_SITE_OTHER): Payer: BC Managed Care – PPO | Admitting: Physician Assistant

## 2019-08-12 VITALS — BP 122/84 | HR 88 | Temp 98.7°F | Resp 14 | Ht 64.0 in | Wt 158.0 lb

## 2019-08-12 DIAGNOSIS — F329 Major depressive disorder, single episode, unspecified: Secondary | ICD-10-CM

## 2019-08-12 DIAGNOSIS — F419 Anxiety disorder, unspecified: Secondary | ICD-10-CM

## 2019-08-12 DIAGNOSIS — Z0000 Encounter for general adult medical examination without abnormal findings: Secondary | ICD-10-CM

## 2019-08-12 DIAGNOSIS — F32A Depression, unspecified: Secondary | ICD-10-CM

## 2019-08-12 LAB — COMPREHENSIVE METABOLIC PANEL
ALT: 44 U/L — ABNORMAL HIGH (ref 0–35)
AST: 26 U/L (ref 0–37)
Albumin: 4.5 g/dL (ref 3.5–5.2)
Alkaline Phosphatase: 72 U/L (ref 39–117)
BUN: 9 mg/dL (ref 6–23)
CO2: 29 mEq/L (ref 19–32)
Calcium: 9.2 mg/dL (ref 8.4–10.5)
Chloride: 101 mEq/L (ref 96–112)
Creatinine, Ser: 0.64 mg/dL (ref 0.40–1.20)
GFR: 102.36 mL/min (ref >60.00)
Glucose, Bld: 78 mg/dL (ref 70–99)
Potassium: 4.3 mEq/L (ref 3.5–5.1)
Sodium: 138 mEq/L (ref 135–145)
Total Bilirubin: 0.4 mg/dL (ref 0.2–1.2)
Total Protein: 7 g/dL (ref 6.0–8.3)

## 2019-08-12 LAB — LIPID PANEL
Cholesterol: 203 mg/dL — ABNORMAL HIGH (ref 0–200)
HDL: 43.3 mg/dL (ref >39.00)
LDL Cholesterol: 135 mg/dL — ABNORMAL HIGH (ref 0–99)
NonHDL: 159.25
Total CHOL/HDL Ratio: 5
Triglycerides: 122 mg/dL (ref 0.0–149.0)
VLDL: 24.4 mg/dL (ref 0.0–40.0)

## 2019-08-12 LAB — CBC WITH DIFFERENTIAL/PLATELET
Basophils Absolute: 0.1 10*3/uL (ref 0.0–0.1)
Basophils Relative: 1 % (ref 0.0–3.0)
Eosinophils Absolute: 0.4 10*3/uL (ref 0.0–0.7)
Eosinophils Relative: 6.3 % — ABNORMAL HIGH (ref 0.0–5.0)
HCT: 44.2 % (ref 36.0–46.0)
Hemoglobin: 14.9 g/dL (ref 12.0–15.0)
Lymphocytes Relative: 27 % (ref 12.0–46.0)
Lymphs Abs: 1.6 10*3/uL (ref 0.7–4.0)
MCHC: 33.8 g/dL (ref 30.0–36.0)
MCV: 98.5 fl (ref 78.0–100.0)
Monocytes Absolute: 0.6 10*3/uL (ref 0.1–1.0)
Monocytes Relative: 9.3 % (ref 3.0–12.0)
Neutro Abs: 3.4 10*3/uL (ref 1.4–7.7)
Neutrophils Relative %: 56.4 % (ref 43.0–77.0)
Platelets: 275 10*3/uL (ref 150.0–400.0)
RBC: 4.48 Mil/uL (ref 3.87–5.11)
RDW: 13.1 % (ref 11.5–15.5)
WBC: 6 10*3/uL (ref 4.0–10.5)

## 2019-08-12 LAB — HEMOGLOBIN A1C: Hgb A1c MFr Bld: 5.8 % (ref 4.6–6.5)

## 2019-08-12 MED ORDER — CETIRIZINE HCL 10 MG PO TABS: 10.0000 mg | ORAL_TABLET | Freq: Every day | ORAL | 1 refills | Status: DC

## 2019-08-12 NOTE — Progress Notes (Signed)
Patient presents to clinic today for annual exam.  Patient is fasting for labs.  Diet -- well-balanced overall Exercise -- off/on doing cardio. Is walking on treadmill a few times per week at present.   Acute Concerns: Denies acute concerns today.   Chronic Issues: Anxiety -- Patient is currently on a regimen of Fluoxetine 20 mg QD and Alprazolam 0.5 mg PRN. Endorses taking medications as directed. Fluoxetine helps a lot. Denies SI/HI. Rare Alprazolam use -- maybe 1/2 tablet 2 times per month.   Allergic Rhinitis -- Takes OTC Zyrtec with good relief. Would like to see about getting this written as a prescription to be cheaper. .   Health Maintenance: Immunizations -- Flu shot UTD Mammogram --Average Risk. UTD - 10/2018. Followed by GYN. PAP -- Followed by GYN (Dr. Mysinger)  Past Medical History:  Diagnosis Date  . Anxiety   . Asthma   . HSV (herpes simplex virus) anogenital infection     Past Surgical History:  Procedure Laterality Date  . WISDOM TOOTH EXTRACTION      Current Outpatient Medications on File Prior to Visit  Medication Sig Dispense Refill  . albuterol (PROVENTIL HFA;VENTOLIN HFA) 108 (90 Base) MCG/ACT inhaler Inhale 2 puffs into the lungs every 6 (six) hours as needed for wheezing or shortness of breath. 6.7 g 3  . ALPRAZolam (XANAX) 0.5 MG tablet TAKE 1 TABLET BY MOUTH EVERY EVENING AT BEDTIME 30 tablet 1  . budesonide-formoterol (SYMBICORT) 80-4.5 MCG/ACT inhaler Inhale 2 puffs into the lungs 2 (two) times daily as needed. 10.2 Inhaler 0  . cetirizine (ZYRTEC) 10 MG tablet Take 10 mg by mouth daily.    . Etonogestrel (IMPLANON [place]) Inject into the skin.    Marland Kitchen FLUoxetine (PROZAC) 20 MG tablet Take 1 tablet (20 mg total) by mouth daily. 90 tablet 1  . Probiotic Product (PROBIOTIC DAILY PO) Take by mouth.     No current facility-administered medications on file prior to visit.     Allergies  Allergen Reactions  . Dog Epithelium Allergy Skin Test   .  Shellfish Allergy Other (See Comments)    Skin test   . Penicillins Rash    infant    Family History  Problem Relation Age of Onset  . Asthma Mother   . Hyperlipidemia Father   . Hypertension Father   . Asthma Maternal Grandfather     Social History   Socioeconomic History  . Marital status: Married    Spouse name: Not on file  . Number of children: Not on file  . Years of education: Not on file  . Highest education level: Not on file  Occupational History  . Not on file  Social Needs  . Financial resource strain: Not on file  . Food insecurity    Worry: Not on file    Inability: Not on file  . Transportation needs    Medical: Not on file    Non-medical: Not on file  Tobacco Use  . Smoking status: Never Smoker  . Smokeless tobacco: Never Used  Substance and Sexual Activity  . Alcohol use: No  . Drug use: No  . Sexual activity: Yes  Lifestyle  . Physical activity    Days per week: Not on file    Minutes per session: Not on file  . Stress: Not on file  Relationships  . Social Herbalist on phone: Not on file    Gets together: Not on file  Attends religious service: Not on file    Active member of club or organization: Not on file    Attends meetings of clubs or organizations: Not on file    Relationship status: Not on file  . Intimate partner violence    Fear of current or ex partner: Not on file    Emotionally abused: Not on file    Physically abused: Not on file    Forced sexual activity: Not on file  Other Topics Concern  . Not on file  Social History Narrative  . Not on file   Review of Systems  Constitutional: Negative for fever and weight loss.  HENT: Negative for ear discharge, ear pain, hearing loss and tinnitus.   Eyes: Negative for blurred vision, double vision, photophobia, pain, discharge and redness.  Respiratory: Negative for cough and shortness of breath.   Cardiovascular: Negative for chest pain and palpitations.   Gastrointestinal: Negative for abdominal pain, blood in stool, constipation, diarrhea, heartburn, melena, nausea and vomiting.  Genitourinary: Negative for dysuria, flank pain, frequency, hematuria and urgency.  Musculoskeletal: Negative for falls.  Neurological: Negative for dizziness, loss of consciousness and headaches.  Endo/Heme/Allergies: Negative for environmental allergies.  Psychiatric/Behavioral: Negative for depression, hallucinations, substance abuse and suicidal ideas. The patient is not nervous/anxious and does not have insomnia.    BP 122/84   Pulse 88   Temp 98.7 F (37.1 C) (Temporal)   Resp 14   Ht 5\' 4"  (1.626 m)   Wt 158 lb (71.7 kg)   SpO2 98%   BMI 27.12 kg/m   Physical Exam Vitals signs reviewed.  HENT:     Head: Normocephalic and atraumatic.     Right Ear: Tympanic membrane, ear canal and external ear normal.     Left Ear: Tympanic membrane, ear canal and external ear normal.     Nose: Nose normal. No mucosal edema.     Mouth/Throat:     Pharynx: Uvula midline. No oropharyngeal exudate or posterior oropharyngeal erythema.  Eyes:     Conjunctiva/sclera: Conjunctivae normal.     Pupils: Pupils are equal, round, and reactive to light.  Neck:     Musculoskeletal: Neck supple.     Thyroid: No thyromegaly.  Cardiovascular:     Rate and Rhythm: Normal rate and regular rhythm.     Heart sounds: Normal heart sounds.  Pulmonary:     Effort: Pulmonary effort is normal. No respiratory distress.     Breath sounds: Normal breath sounds. No wheezing or rales.  Abdominal:     General: Bowel sounds are normal. There is no distension.     Palpations: Abdomen is soft. There is no mass.     Tenderness: There is no abdominal tenderness. There is no guarding or rebound.  Lymphadenopathy:     Cervical: No cervical adenopathy.  Skin:    General: Skin is warm and dry.     Findings: No rash.  Neurological:     Mental Status: She is alert and oriented to person, place,  and time.     Cranial Nerves: No cranial nerve deficit.    Assessment/Plan: 1. Routine general medical examination at a health care facility Depression screen negative. Health Maintenance reviewed. Preventive schedule discussed and handout given in AVS. Will obtain fasting labs today.  - CBC with Differential/Platelet - Comprehensive metabolic panel - Hemoglobin A1c - Lipid panel  2. Anxiety and depression Stable. Continue current regimen. Follow-up with PCP Q6 months for this.    Sandria Manly  Hassell Done, PA-C

## 2019-08-12 NOTE — Patient Instructions (Signed)
Please go to the lab for blood work.   Our office will call you with your results unless you have chosen to receive results via MyChart.  If your blood work is normal we will follow-up each year for physicals and as scheduled for chronic medical problems.  If anything is abnormal we will treat accordingly and get you in for a follow-up.  Make sure to follow-up with GYN as scheduled.    Preventive Care 32-41 Years Old, Female Preventive care refers to visits with your health care provider and lifestyle choices that can promote health and wellness. This includes:  A yearly physical exam. This may also be called an annual well check.  Regular dental visits and eye exams.  Immunizations.  Screening for certain conditions.  Healthy lifestyle choices, such as eating a healthy diet, getting regular exercise, not using drugs or products that contain nicotine and tobacco, and limiting alcohol use. What can I expect for my preventive care visit? Physical exam Your health care provider will check your:  Height and weight. This may be used to calculate body mass index (BMI), which tells if you are at a healthy weight.  Heart rate and blood pressure.  Skin for abnormal spots. Counseling Your health care provider may ask you questions about your:  Alcohol, tobacco, and drug use.  Emotional well-being.  Home and relationship well-being.  Sexual activity.  Eating habits.  Work and work Statistician.  Method of birth control.  Menstrual cycle.  Pregnancy history. What immunizations do I need?  Influenza (flu) vaccine  This is recommended every year. Tetanus, diphtheria, and pertussis (Tdap) vaccine  You may need a Td booster every 10 years. Varicella (chickenpox) vaccine  You may need this if you have not been vaccinated. Zoster (shingles) vaccine  You may need this after age 59. Measles, mumps, and rubella (MMR) vaccine  You may need at least one dose of MMR if you  were born in 1957 or later. You may also need a second dose. Pneumococcal conjugate (PCV13) vaccine  You may need this if you have certain conditions and were not previously vaccinated. Pneumococcal polysaccharide (PPSV23) vaccine  You may need one or two doses if you smoke cigarettes or if you have certain conditions. Meningococcal conjugate (MenACWY) vaccine  You may need this if you have certain conditions. Hepatitis A vaccine  You may need this if you have certain conditions or if you travel or work in places where you may be exposed to hepatitis A. Hepatitis B vaccine  You may need this if you have certain conditions or if you travel or work in places where you may be exposed to hepatitis B. Haemophilus influenzae type b (Hib) vaccine  You may need this if you have certain conditions. Human papillomavirus (HPV) vaccine  If recommended by your health care provider, you may need three doses over 6 months. You may receive vaccines as individual doses or as more than one vaccine together in one shot (combination vaccines). Talk with your health care provider about the risks and benefits of combination vaccines. What tests do I need? Blood tests  Lipid and cholesterol levels. These may be checked every 5 years, or more frequently if you are over 37 years old.  Hepatitis C test.  Hepatitis B test. Screening  Lung cancer screening. You may have this screening every year starting at age 51 if you have a 30-pack-year history of smoking and currently smoke or have quit within the past 15 years.  Colorectal cancer screening. All adults should have this screening starting at age 18 and continuing until age 38. Your health care provider may recommend screening at age 58 if you are at increased risk. You will have tests every 1-10 years, depending on your results and the type of screening test.  Diabetes screening. This is done by checking your blood sugar (glucose) after you have not  eaten for a while (fasting). You may have this done every 1-3 years.  Mammogram. This may be done every 1-2 years. Talk with your health care provider about when you should start having regular mammograms. This may depend on whether you have a family history of breast cancer.  BRCA-related cancer screening. This may be done if you have a family history of breast, ovarian, tubal, or peritoneal cancers.  Pelvic exam and Pap test. This may be done every 3 years starting at age 49. Starting at age 30, this may be done every 5 years if you have a Pap test in combination with an HPV test. Other tests  Sexually transmitted disease (STD) testing.  Bone density scan. This is done to screen for osteoporosis. You may have this scan if you are at high risk for osteoporosis. Follow these instructions at home: Eating and drinking  Eat a diet that includes fresh fruits and vegetables, whole grains, lean protein, and low-fat dairy.  Take vitamin and mineral supplements as recommended by your health care provider.  Do not drink alcohol if: ? Your health care provider tells you not to drink. ? You are pregnant, may be pregnant, or are planning to become pregnant.  If you drink alcohol: ? Limit how much you have to 0-1 drink a day. ? Be aware of how much alcohol is in your drink. In the [place] one drink equals one 12 oz bottle of beer (355 mL), one 5 oz glass of wine (148 mL), or one 1 oz glass of hard liquor (44 mL). Lifestyle  Take daily care of your teeth and gums.  Stay active. Exercise for at least 30 minutes on 5 or more days each week.  Do not use any products that contain nicotine or tobacco, such as cigarettes, e-cigarettes, and chewing tobacco. If you need help quitting, ask your health care provider.  If you are sexually active, practice safe sex. Use a condom or other form of birth control (contraception) in order to prevent pregnancy and STIs (sexually transmitted infections).  If told  by your health care provider, take low-dose aspirin daily starting at age 19. What's next?  Visit your health care provider once a year for a well check visit.  Ask your health care provider how often you should have your eyes and teeth checked.  Stay up to date on all vaccines. This information is not intended to replace advice given to you by your health care provider. Make sure you discuss any questions you have with your health care provider. Document Released: 11/11/2015 Document Revised: 06/26/2018 Document Reviewed: 06/26/2018 Elsevier Patient Education  2020 [employer]. .

## 2019-08-13 ENCOUNTER — Other Ambulatory Visit: Payer: Self-pay | Admitting: Family Medicine

## 2019-08-13 ENCOUNTER — Other Ambulatory Visit: Payer: Self-pay | Admitting: Emergency Medicine

## 2019-08-13 DIAGNOSIS — R7989 Other specified abnormal findings of blood chemistry: Secondary | ICD-10-CM

## 2019-08-13 NOTE — Telephone Encounter (Signed)
Last OV 12/09/18 Alprazolam last filled 08/01/18 #30 with 1

## 2019-08-19 ENCOUNTER — Encounter: Payer: Self-pay | Admitting: Physician Assistant

## 2019-09-11 ENCOUNTER — Ambulatory Visit (INDEPENDENT_AMBULATORY_CARE_PROVIDER_SITE_OTHER): Payer: BC Managed Care – PPO

## 2019-09-11 ENCOUNTER — Other Ambulatory Visit: Payer: Self-pay

## 2019-09-11 DIAGNOSIS — R7989 Other specified abnormal findings of blood chemistry: Secondary | ICD-10-CM

## 2019-09-11 LAB — HEPATIC FUNCTION PANEL
ALT: 57 U/L — ABNORMAL HIGH (ref 0–35)
AST: 35 U/L (ref 0–37)
Albumin: 4.5 g/dL (ref 3.5–5.2)
Alkaline Phosphatase: 64 U/L (ref 39–117)
Bilirubin, Direct: 0.1 mg/dL (ref 0.0–0.3)
Total Bilirubin: 0.7 mg/dL (ref 0.2–1.2)
Total Protein: 7.1 g/dL (ref 6.0–8.3)

## 2019-09-17 ENCOUNTER — Encounter: Payer: Self-pay | Admitting: Emergency Medicine

## 2019-09-29 ENCOUNTER — Other Ambulatory Visit: Payer: Self-pay | Admitting: Family Medicine

## 2019-10-12 DIAGNOSIS — Z20828 Contact with and (suspected) exposure to other viral communicable diseases: Secondary | ICD-10-CM | POA: Diagnosis not present

## 2019-10-12 DIAGNOSIS — Z03818 Encounter for observation for suspected exposure to other biological agents ruled out: Secondary | ICD-10-CM | POA: Diagnosis not present

## 2019-11-15 ENCOUNTER — Other Ambulatory Visit: Payer: Self-pay | Admitting: Family Medicine

## 2019-11-26 DIAGNOSIS — Z03818 Encounter for observation for suspected exposure to other biological agents ruled out: Secondary | ICD-10-CM | POA: Diagnosis not present

## 2019-11-26 DIAGNOSIS — U071 COVID-19: Secondary | ICD-10-CM | POA: Diagnosis not present

## 2019-11-26 DIAGNOSIS — Z20828 Contact with and (suspected) exposure to other viral communicable diseases: Secondary | ICD-10-CM | POA: Diagnosis not present

## 2019-12-01 DIAGNOSIS — Z03818 Encounter for observation for suspected exposure to other biological agents ruled out: Secondary | ICD-10-CM | POA: Diagnosis not present

## 2019-12-01 DIAGNOSIS — Z20828 Contact with and (suspected) exposure to other viral communicable diseases: Secondary | ICD-10-CM | POA: Diagnosis not present

## 2019-12-24 ENCOUNTER — Encounter: Payer: Self-pay | Admitting: Family Medicine

## 2019-12-24 ENCOUNTER — Other Ambulatory Visit: Payer: Self-pay

## 2019-12-24 ENCOUNTER — Ambulatory Visit (INDEPENDENT_AMBULATORY_CARE_PROVIDER_SITE_OTHER): Payer: BC Managed Care – PPO | Admitting: Family Medicine

## 2019-12-24 VITALS — BP 114/81 | HR 86 | Temp 98.5°F | Resp 17 | Ht 64.0 in | Wt 163.4 lb

## 2019-12-24 DIAGNOSIS — M79642 Pain in left hand: Secondary | ICD-10-CM | POA: Diagnosis not present

## 2019-12-24 DIAGNOSIS — M25561 Pain in right knee: Secondary | ICD-10-CM

## 2019-12-24 MED ORDER — MELOXICAM 15 MG PO TABS: 15.0000 mg | ORAL_TABLET | Freq: Every day | ORAL | 0 refills | Status: DC

## 2019-12-24 NOTE — Patient Instructions (Signed)
Follow up as needed or as scheduled We'll call you with your hand referral START the Meloxicam once daily- take w/ food ICE the knee as needed AVOID other advil/aleve/motrin/etc Take tylenol as needed for breakthrough pain Call with any questions or concerns Stay Safe!  Stay Healthy!

## 2019-12-24 NOTE — Progress Notes (Signed)
   Subjective:    Patient ID: Cassandra Gross, female    DOB: 1976-01-03, 44 y.o.   MRN: [id number]  HPI Fall- pt fell 2 weeks ago yesterday.  Pt fell on L hand and 'all fingers bent back'.  Having pain in the palm around 2nd,3rd, and 4th fingers.  Pain both palmar and dorsal surface of hand.  Hand did swell and there was some bruising.  Pt has pain w/ tight flexion or full extension of fingers.  Area is TTP.  Pt is R hand dominant.  Also fell on R knee.  TTP but no pain w/ walking, flexion/extension.   Review of Systems For ROS see HPI   This visit occurred during the SARS-CoV-2 public health emergency.  Safety protocols were in place, including screening questions prior to the visit, additional usage of staff PPE, and extensive cleaning of exam room while observing appropriate contact time as indicated for disinfecting solutions.       Objective:   Physical Exam Vitals reviewed.  Constitutional:      General: She is not in acute distress.    Appearance: Normal appearance. She is not ill-appearing.  HENT:     Head: Normocephalic and atraumatic.  Cardiovascular:     Pulses: Normal pulses.  Musculoskeletal:     Comments: Full ROM of R knee, no pain w/ weight bearing Pain w/ flexion and extension of L 2-4 MCP joints w/ TTP over both palmar and dorsal surfaces of distal metacarpals  Skin:    General: Skin is warm and dry.  Neurological:     Mental Status: She is alert and oriented to person, place, and time. Mental status is at baseline.           Assessment & Plan:  L hand pain- new.  Given that it was a hyperextension injury and she continues to have pain w/ flexion and extension at MCP joints after 2 weeks, will refer to hand specialist.  Does not appear to be a bony injury but rather soft tissue.  Start daily Meloxicam.  Reviewed supportive care and red flags that should prompt return.  Pt expressed understanding and is in agreement w/ plan.   R knee pain- resolving.  Ice  prn.  Daily Meloxicam will also help.  Pt expressed understanding and is in agreement w/ plan.

## 2019-12-30 DIAGNOSIS — S63655A Sprain of metacarpophalangeal joint of left ring finger, initial encounter: Secondary | ICD-10-CM | POA: Diagnosis not present

## 2019-12-30 DIAGNOSIS — M79642 Pain in left hand: Secondary | ICD-10-CM | POA: Diagnosis not present

## 2019-12-30 DIAGNOSIS — S63653A Sprain of metacarpophalangeal joint of left middle finger, initial encounter: Secondary | ICD-10-CM | POA: Diagnosis not present

## 2019-12-30 DIAGNOSIS — S63659A Sprain of metacarpophalangeal joint of unspecified finger, initial encounter: Secondary | ICD-10-CM | POA: Insufficient documentation

## 2020-01-06 ENCOUNTER — Encounter: Payer: Self-pay | Admitting: Family Medicine

## 2020-01-12 DIAGNOSIS — Z0279 Encounter for issue of other medical certificate: Secondary | ICD-10-CM

## 2020-01-28 ENCOUNTER — Other Ambulatory Visit: Payer: Self-pay | Admitting: Family Medicine

## 2020-01-28 NOTE — Telephone Encounter (Signed)
Meloxicam 15mg  tab LFD 12/24/19 # 30 with no refills LOV 12/24/19, no f/u scheduled.  Please advise.

## 2020-05-09 ENCOUNTER — Other Ambulatory Visit: Payer: Self-pay | Admitting: Physician Assistant

## 2020-05-09 ENCOUNTER — Other Ambulatory Visit: Payer: Self-pay | Admitting: Family Medicine

## 2020-05-09 ENCOUNTER — Encounter: Payer: Self-pay | Admitting: Family Medicine

## 2020-05-09 NOTE — Telephone Encounter (Signed)
Last OV 12/24/19 Alprazolam last filled 08/17/19 #30 with 0

## 2020-05-24 DIAGNOSIS — Z1231 Encounter for screening mammogram for malignant neoplasm of breast: Secondary | ICD-10-CM | POA: Diagnosis not present

## 2020-05-24 DIAGNOSIS — Z130 Encounter for screening for diseases of the blood and blood-forming organs and certain disorders involving the immune mechanism: Secondary | ICD-10-CM | POA: Diagnosis not present

## 2020-05-24 DIAGNOSIS — Z1151 Encounter for screening for human papillomavirus (HPV): Secondary | ICD-10-CM | POA: Diagnosis not present

## 2020-05-24 DIAGNOSIS — Z01419 Encounter for gynecological examination (general) (routine) without abnormal findings: Secondary | ICD-10-CM | POA: Diagnosis not present

## 2020-05-24 DIAGNOSIS — Z124 Encounter for screening for malignant neoplasm of cervix: Secondary | ICD-10-CM | POA: Diagnosis not present

## 2020-05-26 LAB — HM PAP SMEAR: HPV Aptima: NEGATIVE

## 2020-07-31 ENCOUNTER — Other Ambulatory Visit: Payer: Self-pay | Admitting: Family Medicine

## 2020-09-29 ENCOUNTER — Other Ambulatory Visit: Payer: Self-pay | Admitting: Family Medicine

## 2020-10-10 DIAGNOSIS — Z03818 Encounter for observation for suspected exposure to other biological agents ruled out: Secondary | ICD-10-CM | POA: Diagnosis not present

## 2020-11-06 ENCOUNTER — Other Ambulatory Visit: Payer: Self-pay | Admitting: Physician Assistant

## 2020-11-09 DIAGNOSIS — Z1152 Encounter for screening for COVID-19: Secondary | ICD-10-CM | POA: Diagnosis not present

## 2020-11-21 ENCOUNTER — Other Ambulatory Visit: Payer: Self-pay | Admitting: Family Medicine

## 2020-12-28 ENCOUNTER — Other Ambulatory Visit: Payer: Self-pay | Admitting: Family Medicine

## 2020-12-28 ENCOUNTER — Other Ambulatory Visit: Payer: Self-pay

## 2020-12-28 DIAGNOSIS — F32A Depression, unspecified: Secondary | ICD-10-CM

## 2020-12-28 DIAGNOSIS — J4530 Mild persistent asthma, uncomplicated: Secondary | ICD-10-CM

## 2020-12-28 DIAGNOSIS — J302 Other seasonal allergic rhinitis: Secondary | ICD-10-CM

## 2020-12-28 DIAGNOSIS — F419 Anxiety disorder, unspecified: Secondary | ICD-10-CM

## 2020-12-28 MED ORDER — CETIRIZINE HCL 10 MG PO TABS: 10.0000 mg | ORAL_TABLET | Freq: Every day | ORAL | 0 refills | Status: DC

## 2020-12-28 MED ORDER — FLUOXETINE HCL 20 MG PO TABS: 20.0000 mg | ORAL_TABLET | Freq: Every day | ORAL | 0 refills | Status: DC

## 2020-12-28 MED ORDER — BUDESONIDE-FORMOTEROL FUMARATE 80-4.5 MCG/ACT IN AERO: INHALATION_SPRAY | RESPIRATORY_TRACT | 0 refills | Status: DC

## 2021-01-03 ENCOUNTER — Other Ambulatory Visit: Payer: Self-pay | Admitting: Emergency Medicine

## 2021-01-03 DIAGNOSIS — F32A Depression, unspecified: Secondary | ICD-10-CM

## 2021-01-03 DIAGNOSIS — F419 Anxiety disorder, unspecified: Secondary | ICD-10-CM

## 2021-01-03 MED ORDER — FLUOXETINE HCL 20 MG PO CAPS: 20.0000 mg | ORAL_CAPSULE | Freq: Every day | ORAL | 0 refills | Status: DC

## 2021-01-06 IMAGING — MG DIGITAL DIAGNOSTIC UNILATERAL RIGHT MAMMOGRAM WITH TOMO AND CAD
8 series · 9 of 24 positions shown · non-contrast
Comparison: Previous exam(s).

CLINICAL DATA: 42-year-old female recalled from baseline screening
mammogram dated 11/11/2018 for a possible right breast asymmetry.

EXAM:
DIGITAL DIAGNOSTIC UNILATERAL RIGHT MAMMOGRAM WITH CAD AND TOMO

[R XCCL synth-2D]
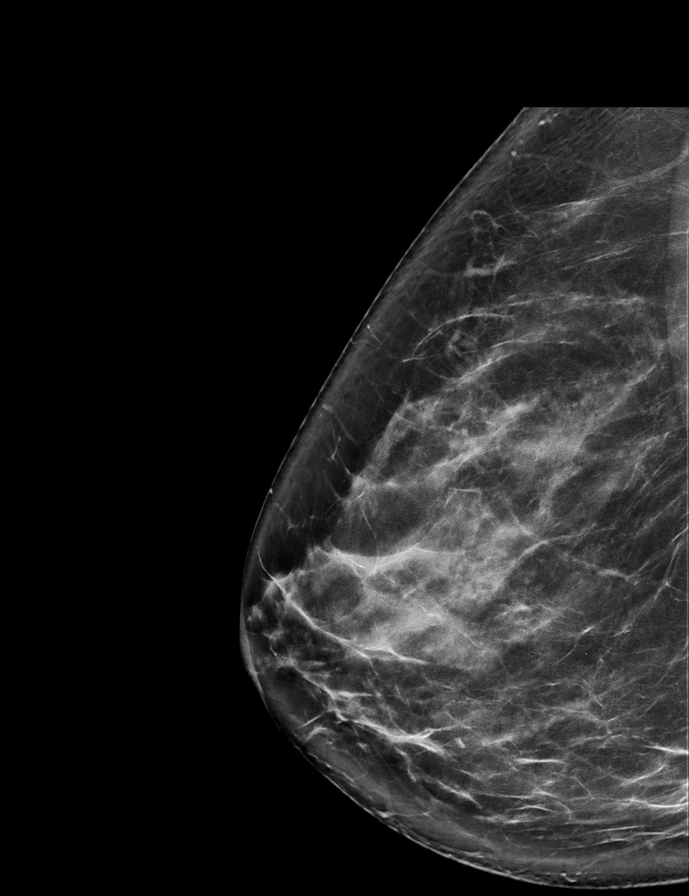

[R MLO synth-2D]
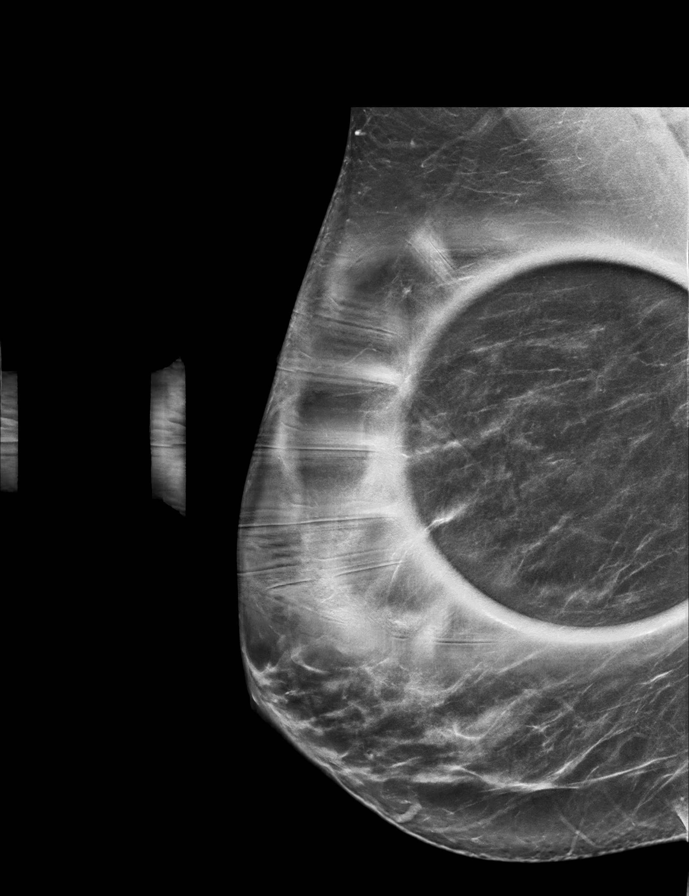

[R CC synth-2D]
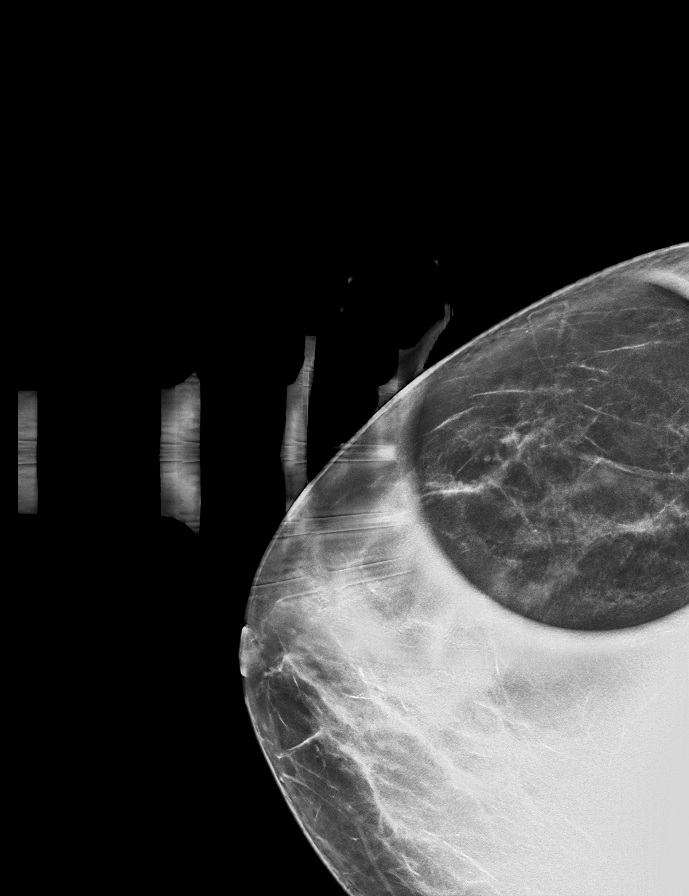

[R ML synth-2D]
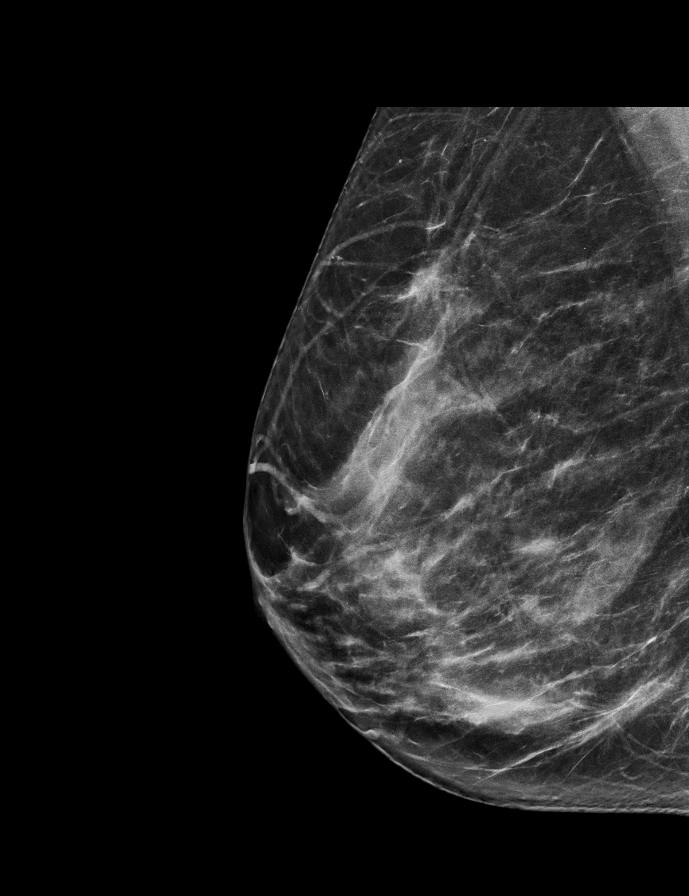

[R MLO tomo · 2 of 82 frames shown]
[frame 27/82]
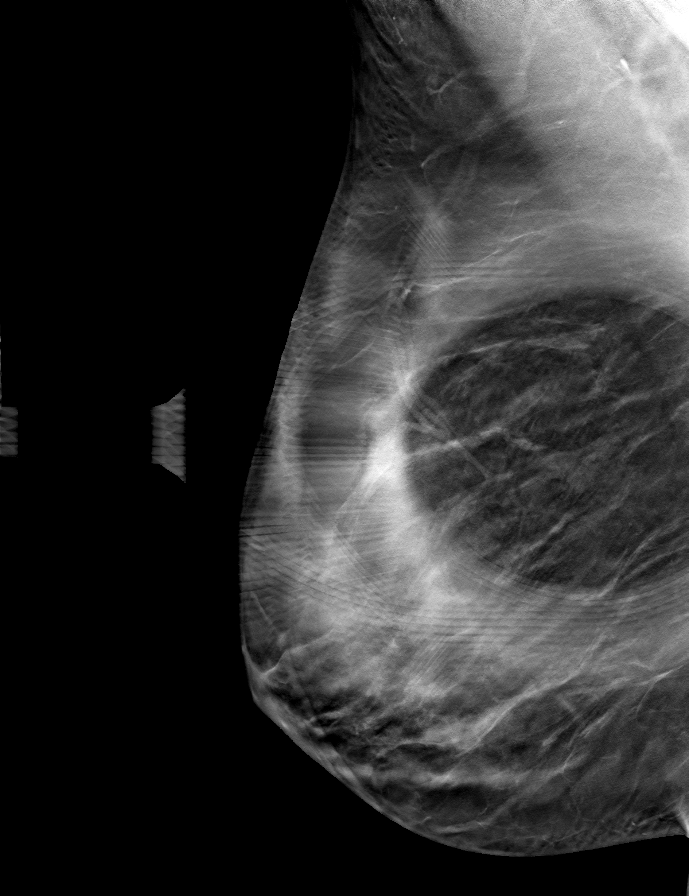
[frame 41/82]
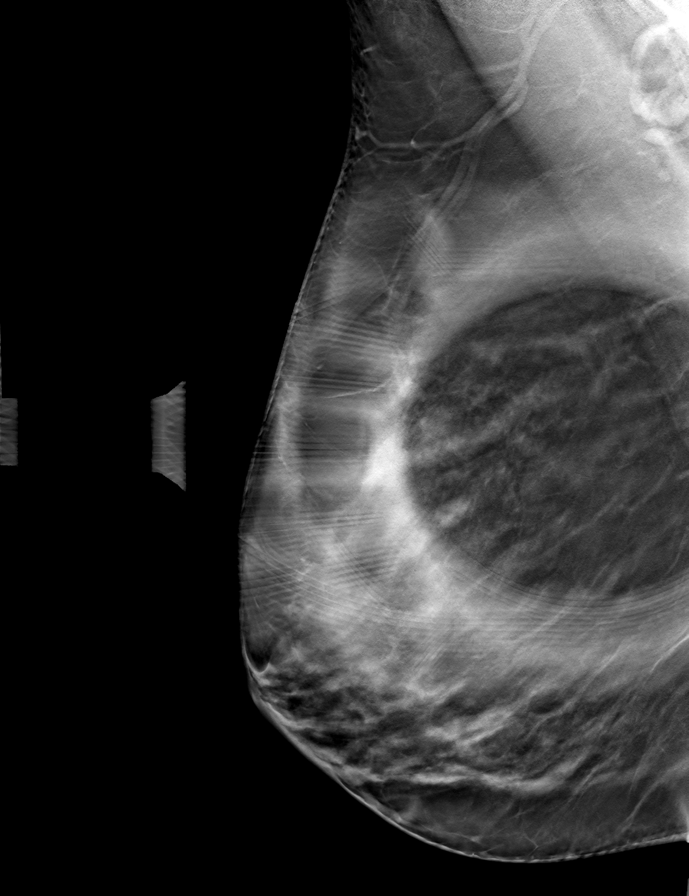

[R CC tomo · tomo slice 35/70.0]
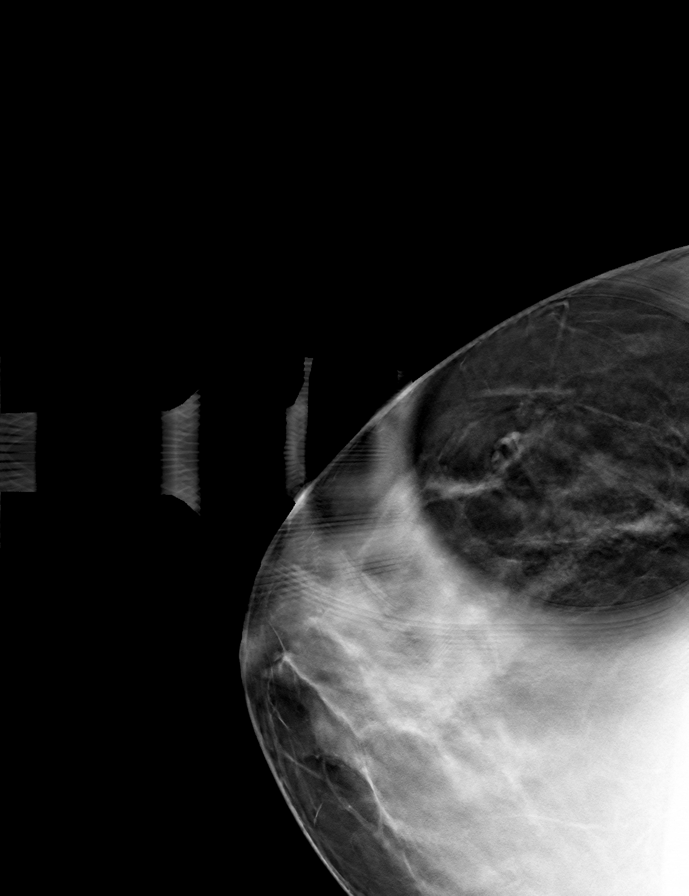

[R XCCL tomo · tomo slice 41/81.0]
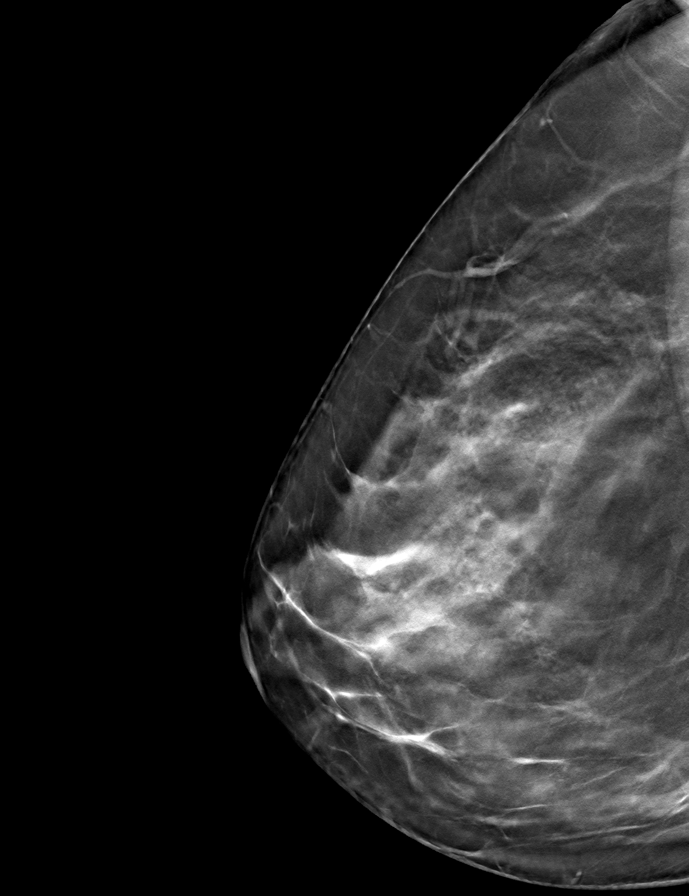

[R ML tomo · tomo slice 37/73.0]
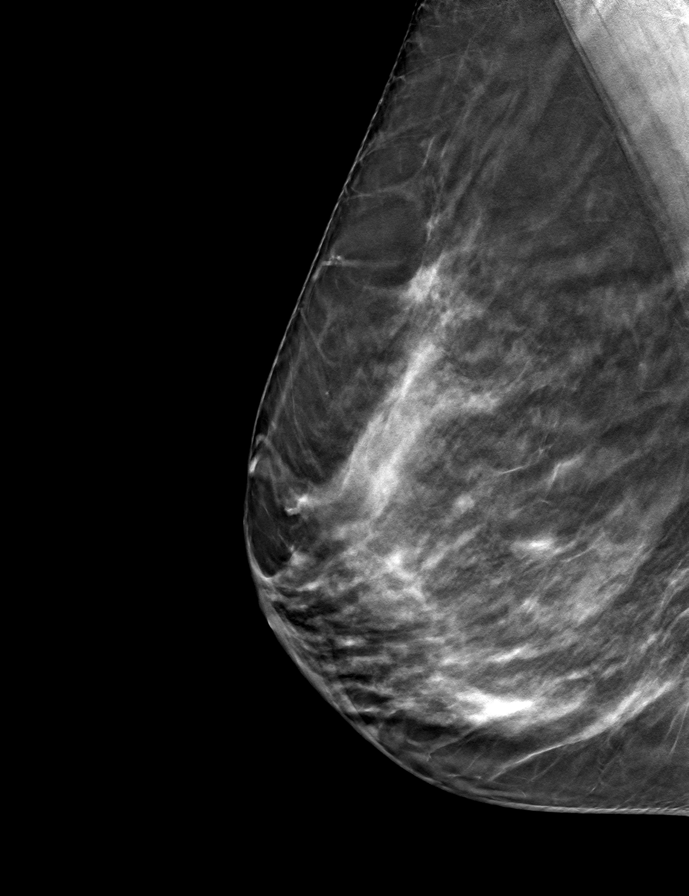

[9 of 24 positions shown; findings below may reference images not displayed]

ACR Breast Density Category c: The breast tissue is heterogeneously
dense, which may obscure small masses.
FINDINGS: Previously described, possible asymmetry in the central lateral
right breast at posterior depth resolves into well dispersed fatty
and fibroglandular tissue on today's additional views. No suspicious
findings are identified.

Mammographic images were processed with CAD.
IMPRESSION: No mammographic evidence of malignancy.

RECOMMENDATION:
Screening mammogram in one year.(Code:[id number]-[id number])

I have discussed the findings and recommendations with the patient.
Results were also provided in writing at the conclusion of the
visit. If applicable, a reminder letter will be sent to the patient
regarding the next appointment.

BI-RADS CATEGORY  1: Negative.

## 2021-01-23 ENCOUNTER — Encounter: Payer: Self-pay | Admitting: Family Medicine

## 2021-01-23 ENCOUNTER — Other Ambulatory Visit: Payer: Self-pay

## 2021-01-23 ENCOUNTER — Ambulatory Visit (INDEPENDENT_AMBULATORY_CARE_PROVIDER_SITE_OTHER): Payer: BC Managed Care – PPO | Admitting: Family Medicine

## 2021-01-23 VITALS — BP 112/80 | Temp 98.3°F | Resp 16 | Ht 64.0 in | Wt 164.6 lb

## 2021-01-23 DIAGNOSIS — F419 Anxiety disorder, unspecified: Secondary | ICD-10-CM

## 2021-01-23 DIAGNOSIS — E559 Vitamin D deficiency, unspecified: Secondary | ICD-10-CM | POA: Diagnosis not present

## 2021-01-23 DIAGNOSIS — Z0000 Encounter for general adult medical examination without abnormal findings: Secondary | ICD-10-CM

## 2021-01-23 DIAGNOSIS — E663 Overweight: Secondary | ICD-10-CM | POA: Diagnosis not present

## 2021-01-23 DIAGNOSIS — F32A Depression, unspecified: Secondary | ICD-10-CM

## 2021-01-23 DIAGNOSIS — J4530 Mild persistent asthma, uncomplicated: Secondary | ICD-10-CM

## 2021-01-23 LAB — BASIC METABOLIC PANEL
BUN: 9 mg/dL (ref 6–23)
CO2: 27 mEq/L (ref 19–32)
Calcium: 9.3 mg/dL (ref 8.4–10.5)
Chloride: 102 mEq/L (ref 96–112)
Creatinine, Ser: 0.6 mg/dL (ref 0.40–1.20)
GFR: 110.84 mL/min (ref >60.00)
Glucose, Bld: 94 mg/dL (ref 70–99)
Potassium: 3.8 mEq/L (ref 3.5–5.1)
Sodium: 138 mEq/L (ref 135–145)

## 2021-01-23 LAB — LIPID PANEL
Cholesterol: 187 mg/dL (ref 0–200)
HDL: 44.7 mg/dL (ref >39.00)
LDL Cholesterol: 113 mg/dL — ABNORMAL HIGH (ref 0–99)
NonHDL: 142.58
Total CHOL/HDL Ratio: 4
Triglycerides: 146 mg/dL (ref 0.0–149.0)
VLDL: 29.2 mg/dL (ref 0.0–40.0)

## 2021-01-23 LAB — CBC WITH DIFFERENTIAL/PLATELET
Basophils Absolute: 0.1 10*3/uL (ref 0.0–0.1)
Basophils Relative: 1.2 % (ref 0.0–3.0)
Eosinophils Absolute: 0.6 10*3/uL (ref 0.0–0.7)
Eosinophils Relative: 8.8 % — ABNORMAL HIGH (ref 0.0–5.0)
HCT: 44.8 % (ref 36.0–46.0)
Hemoglobin: 15.1 g/dL — ABNORMAL HIGH (ref 12.0–15.0)
Lymphocytes Relative: 22.7 % (ref 12.0–46.0)
Lymphs Abs: 1.6 10*3/uL (ref 0.7–4.0)
MCHC: 33.7 g/dL (ref 30.0–36.0)
MCV: 97.3 fl (ref 78.0–100.0)
Monocytes Absolute: 0.5 10*3/uL (ref 0.1–1.0)
Monocytes Relative: 6.9 % (ref 3.0–12.0)
Neutro Abs: 4.3 10*3/uL (ref 1.4–7.7)
Neutrophils Relative %: 60.4 % (ref 43.0–77.0)
Platelets: 290 10*3/uL (ref 150.0–400.0)
RBC: 4.6 Mil/uL (ref 3.87–5.11)
RDW: 13.3 % (ref 11.5–15.5)
WBC: 7 10*3/uL (ref 4.0–10.5)

## 2021-01-23 LAB — HEPATIC FUNCTION PANEL
ALT: 42 U/L — ABNORMAL HIGH (ref 0–35)
AST: 30 U/L (ref 0–37)
Albumin: 4.3 g/dL (ref 3.5–5.2)
Alkaline Phosphatase: 67 U/L (ref 39–117)
Bilirubin, Direct: 0.1 mg/dL (ref 0.0–0.3)
Total Bilirubin: 0.5 mg/dL (ref 0.2–1.2)
Total Protein: 7 g/dL (ref 6.0–8.3)

## 2021-01-23 LAB — TSH: TSH: 3.49 u[IU]/mL (ref 0.35–4.50)

## 2021-01-23 LAB — VITAMIN D 25 HYDROXY (VIT D DEFICIENCY, FRACTURES): VITD: 21.73 ng/mL — ABNORMAL LOW (ref 30.00–100.00)

## 2021-01-23 MED ORDER — ALPRAZOLAM 0.5 MG PO TABS: 0.5000 mg | ORAL_TABLET | Freq: Every day | ORAL | 1 refills | Status: DC

## 2021-01-23 MED ORDER — BUDESONIDE-FORMOTEROL FUMARATE 80-4.5 MCG/ACT IN AERO: INHALATION_SPRAY | RESPIRATORY_TRACT | 0 refills | Status: DC

## 2021-01-23 MED ORDER — FLUOXETINE HCL 20 MG PO CAPS: 20.0000 mg | ORAL_CAPSULE | Freq: Every day | ORAL | 0 refills | Status: DC

## 2021-01-23 NOTE — Assessment & Plan Note (Signed)
Pt has hx of this.  Check labs and replete prn. 

## 2021-01-23 NOTE — Assessment & Plan Note (Signed)
BMI is 28.25.  Stressed need for healthy diet and regular exercise.  Check labs to risk stratify.  Will follow.

## 2021-01-23 NOTE — Progress Notes (Signed)
   Subjective:    Patient ID: Cassandra Gross, female    DOB: 1976/04/08, 45 y.o.   MRN: [id number]  HPI CPE- UTD on pap, Tdap.  Due for mammo- Dr Willis Modena  Reviewed past medical, surgical, family and social histories.   Patient Care Team    Relationship Specialty Notifications Start End  Midge Minium, MD PCP - General Family Medicine  03/01/14   Cheri Fowler, MD Consulting Physician Obstetrics and Gynecology  03/22/15     Health Maintenance  Topic Date Due  . INFLUENZA VACCINE  03/11/2021 (Originally 05/29/2020)  . Hepatitis C Screening  01/23/2022 (Originally [birth year])  . TETANUS/TDAP  09/10/2022  . PAP SMEAR-Modifier  05/26/2023  . HIV Screening  Completed  . HPV VACCINES  Aged Out      Review of Systems Patient reports no vision/ hearing changes, adenopathy,fever, weight change,  persistant/recurrent hoarseness , swallowing issues, chest pain, palpitations, edema, persistant/recurrent cough, hemoptysis, dyspnea (rest/exertional/paroxysmal nocturnal), gastrointestinal bleeding (melena, rectal bleeding), abdominal pain, significant heartburn, bowel changes, GU symptoms (dysuria, hematuria, incontinence), Gyn symptoms (abnormal  bleeding, pain),  syncope, focal weakness, memory loss, numbness & tingling, skin/hair/nail changes, abnormal bruising or bleeding, anxiety, or depression.   This visit occurred during the SARS-CoV-2 public health emergency.  Safety protocols were in place, including screening questions prior to the visit, additional usage of staff PPE, and extensive cleaning of exam room while observing appropriate contact time as indicated for disinfecting solutions.       Objective:   Physical Exam General Appearance:    Alert, cooperative, no distress, appears stated age  Head:    Normocephalic, without obvious abnormality, atraumatic  Eyes:    PERRL, conjunctiva/corneas clear, EOM's intact, fundi    benign, both eyes  Ears:    Normal TM's and external ear  canals, both ears  Nose:   Deferred due to COVID  Throat:   Neck:   Supple, symmetrical, trachea midline, no adenopathy;    Thyroid: no enlargement/tenderness/nodules  Back:     Symmetric, no curvature, ROM normal, no CVA tenderness  Lungs:     Clear to auscultation bilaterally, respirations unlabored  Chest Wall:    No tenderness or deformity   Heart:    Regular rate and rhythm, S1 and S2 normal, no murmur, rub   or gallop  Breast Exam:    Deferred to GYN  Abdomen:     Soft, non-tender, bowel sounds active all four quadrants,    no masses, no organomegaly  Genitalia:    Deferred to GYN  Rectal:    Extremities:   Extremities normal, atraumatic, no cyanosis or edema  Pulses:   2+ and symmetric all extremities  Skin:   Skin color, texture, turgor normal, no rashes or lesions  Lymph nodes:   Cervical, supraclavicular, and axillary nodes normal  Neurologic:   CNII-XII intact, normal strength, sensation and reflexes    throughout          Assessment & Plan:

## 2021-01-23 NOTE — Patient Instructions (Addendum)
Follow up in 1 year or as needed We'll notify you of your lab results and make any changes if needed Call and schedule your mammogram Continue to work on healthy diet and regular exercise- you can do it!! Call with any questions or concerns Stay Safe!  Stay Healthy! Happy Spring!!!

## 2021-01-23 NOTE — Assessment & Plan Note (Signed)
Pt's PE WNL w/ exception of being overweight.  UTD on pap.  Due for mammo- pt to schedule.  UTD on Tdap.  Check labs.  Anticipatory guidance provided.

## 2021-01-24 ENCOUNTER — Other Ambulatory Visit: Payer: Self-pay

## 2021-01-24 DIAGNOSIS — E559 Vitamin D deficiency, unspecified: Secondary | ICD-10-CM

## 2021-01-24 MED ORDER — VITAMIN D (ERGOCALCIFEROL) 1.25 MG (50000 UNIT) PO CAPS: 50000.0000 [IU] | ORAL_CAPSULE | ORAL | 0 refills | Status: DC

## 2021-03-09 ENCOUNTER — Encounter: Payer: Self-pay | Admitting: Family Medicine

## 2021-03-10 ENCOUNTER — Other Ambulatory Visit: Payer: Self-pay

## 2021-03-10 ENCOUNTER — Encounter: Payer: Self-pay | Admitting: Family Medicine

## 2021-03-10 ENCOUNTER — Ambulatory Visit: Payer: BC Managed Care – PPO | Admitting: Family Medicine

## 2021-03-10 VITALS — BP 126/80 | HR 93 | Temp 97.7°F | Resp 18 | Ht 64.0 in | Wt 165.4 lb

## 2021-03-10 DIAGNOSIS — T07XXXA Unspecified multiple injuries, initial encounter: Secondary | ICD-10-CM

## 2021-03-10 NOTE — Patient Instructions (Signed)
Follow up as needed or as scheduled These definitely appear to be scratches- either during your sleep or in the shower Check your shower puff for any rough edges Thankfully this is not blood vessels Apply antibiotic ointment if needed Call with any questions or concerns Have a great weekend!!!

## 2021-03-10 NOTE — Progress Notes (Signed)
   Subjective:    Patient ID: Cassandra Gross, female    DOB: Jul 09, 1976, 45 y.o.   MRN: [id number]  HPI Scratches- Tuesday AM felt 'burning on the back of my legs'  Legs felt raw- 'like a deep scratch'.  Pt had 'red lines' on the back of her thighs 'that had blood on the surface'.  Attempted to apply band aids but that was very uncomfortable- but there was blood on the band aids.  Yesterday again had similar sxs on L posterior thigh.   Review of Systems For ROS see HPI   This visit occurred during the SARS-CoV-2 public health emergency.  Safety protocols were in place, including screening questions prior to the visit, additional usage of staff PPE, and extensive cleaning of exam room while observing appropriate contact time as indicated for disinfecting solutions.       Objective:   Physical Exam Vitals reviewed.  Constitutional:      General: She is not in acute distress.    Appearance: Normal appearance. She is not ill-appearing.  HENT:     Head: Normocephalic and atraumatic.  Cardiovascular:     Pulses: Normal pulses.  Musculoskeletal:     Right lower leg: No edema.     Left lower leg: No edema.  Skin:    General: Skin is warm and dry.     Findings: No bruising or erythema.     Comments: + excoriations on inner posterior thighs bilaterally  Neurological:     General: No focal deficit present.     Mental Status: She is alert and oriented to person, place, and time.  Psychiatric:        Mood and Affect: Mood normal.        Behavior: Behavior normal.        Thought Content: Thought content normal.           Assessment & Plan:  Excoriations- new.  No evidence of bruising or broken blood vessels.  No varicose veins present.  Unclear what caused these marks- discussed scratching in our sleep, a plastic knot on her shower puff- but reassured her that this was not vascular.

## 2021-04-08 ENCOUNTER — Other Ambulatory Visit: Payer: Self-pay | Admitting: Family Medicine

## 2021-04-08 DIAGNOSIS — J302 Other seasonal allergic rhinitis: Secondary | ICD-10-CM

## 2021-04-12 ENCOUNTER — Encounter: Payer: Self-pay | Admitting: Family Medicine

## 2021-04-18 ENCOUNTER — Other Ambulatory Visit: Payer: Self-pay

## 2021-04-18 ENCOUNTER — Ambulatory Visit: Payer: BC Managed Care – PPO | Admitting: Family Medicine

## 2021-04-18 ENCOUNTER — Encounter: Payer: Self-pay | Admitting: Family Medicine

## 2021-04-18 VITALS — BP 130/90 | HR 99 | Temp 98.3°F | Resp 18 | Ht 64.0 in | Wt 157.2 lb

## 2021-04-18 DIAGNOSIS — R41840 Attention and concentration deficit: Secondary | ICD-10-CM | POA: Diagnosis not present

## 2021-04-18 MED ORDER — ATOMOXETINE HCL 40 MG PO CAPS: 40.0000 mg | ORAL_CAPSULE | Freq: Every day | ORAL | 1 refills | Status: DC

## 2021-04-18 NOTE — Progress Notes (Signed)
   Subjective:    Patient ID: Cassandra Gross, female    DOB: May 12, 1976, 45 y.o.   MRN: [id number]  HPI Difficulty w/ concentration- pt doesn't feel that sxs are new, but they're becoming more bothersome.  Trouble w/ task completion, forgetting things.  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  Pt reports Cassandra Gross will start a task but gets distracted and will move onto another task w/o completing the first thing.  Pt feels that Cassandra Gross is faking- 'i present that i'm well organized but I'm not'.   Review of Systems For ROS see HPI   This visit occurred during the SARS-CoV-2 public health emergency.  Safety protocols were in place, including screening questions prior to the visit, additional usage of staff PPE, and extensive cleaning of exam room while observing appropriate contact time as indicated for disinfecting solutions.      Objective:   Physical Exam Vitals reviewed.  Constitutional:      General: Cassandra Gross is not in acute distress.    Appearance: Normal appearance. Cassandra Gross is not ill-appearing.  HENT:     Head: Normocephalic and atraumatic.  Eyes:     Extraocular Movements: Extraocular movements intact.     Conjunctiva/sclera: Conjunctivae normal.     Pupils: Pupils are equal, round, and reactive to light.  Neurological:     General: No focal deficit present.     Mental Status: Cassandra Gross is alert and oriented to person, place, and time.     Cranial Nerves: No cranial nerve deficit.     Motor: No weakness.     Gait: Gait normal.  Psychiatric:        Behavior: Behavior normal.        Thought Content: Thought content normal.     Comments: anxious          Assessment & Plan:   Lack of Concentration- new.  Pt has sxs that are suggestive of ADHD- difficulty w/ task completion, easily distracted, occurs both at home and work.  Since Cassandra Gross has never been officially tested, we will start w/ non-stimulant medication and determine if dose adjustment or change in  medicine is needed.  Reviewed timing of medication, possible side effects, time to onset of action.  Pt expressed understanding and is in agreement w/ plan.

## 2021-04-18 NOTE — Patient Instructions (Signed)
Follow up in 1 month to recheck attention/focus START the Strattera once daily.  This may take a few weeks to notice improvement. Don't be afraid to ask those around you if they notice a change IF you develop abdominal pain or yellowing of skin/eyes- STOP (this medicine is processed through the liver) Call with any questions or concerns Hang in there!  We'll figure this out!

## 2021-04-26 ENCOUNTER — Encounter: Payer: Self-pay | Admitting: *Deleted

## 2021-05-01 ENCOUNTER — Other Ambulatory Visit: Payer: Self-pay | Admitting: Family Medicine

## 2021-05-15 ENCOUNTER — Ambulatory Visit: Payer: BC Managed Care – PPO | Admitting: Family Medicine

## 2021-05-29 ENCOUNTER — Other Ambulatory Visit: Payer: Self-pay | Admitting: Family Medicine

## 2021-05-29 DIAGNOSIS — E559 Vitamin D deficiency, unspecified: Secondary | ICD-10-CM

## 2021-06-24 ENCOUNTER — Other Ambulatory Visit: Payer: Self-pay | Admitting: Family Medicine

## 2021-06-26 NOTE — Telephone Encounter (Signed)
LFD 04/18/21 #30 with 1 refill LOV 04/18/21 NOV 07/05/21  Per pharmacy, patient request a 90 day supply

## 2021-07-05 ENCOUNTER — Ambulatory Visit: Payer: BC Managed Care – PPO | Admitting: Family Medicine

## 2021-07-17 ENCOUNTER — Other Ambulatory Visit: Payer: Self-pay

## 2021-07-17 ENCOUNTER — Ambulatory Visit: Payer: BC Managed Care – PPO | Admitting: Family Medicine

## 2021-07-17 ENCOUNTER — Encounter: Payer: Self-pay | Admitting: Family Medicine

## 2021-07-17 VITALS — BP 122/74 | HR 92 | Temp 98.4°F | Resp 16 | Ht 64.0 in | Wt 153.6 lb

## 2021-07-17 DIAGNOSIS — R41840 Attention and concentration deficit: Secondary | ICD-10-CM | POA: Diagnosis not present

## 2021-07-17 DIAGNOSIS — Z23 Encounter for immunization: Secondary | ICD-10-CM

## 2021-07-17 MED ORDER — BUPROPION HCL 75 MG PO TABS: 75.0000 mg | ORAL_TABLET | Freq: Two times a day (BID) | ORAL | 3 refills | Status: DC

## 2021-07-17 NOTE — Progress Notes (Signed)
   Subjective:    Patient ID: Cassandra Gross, female    DOB: Mar 18, 1976, 45 y.o.   MRN: [id number]  HPI Lack of concentration- pt was prescribed Strattera at last visit but never started medication.  Pt was fearful of starting b/c of possible liver dysfxn.  She admits that she enjoys drinking and recent LFTs has been mildly elevated.  This had her very concerned and she didn't want to take the risk.  She admits to ongoing anxiety, depressed mood.   Review of Systems For ROS see HPI   This visit occurred during the SARS-CoV-2 public health emergency.  Safety protocols were in place, including screening questions prior to the visit, additional usage of staff PPE, and extensive cleaning of exam room while observing appropriate contact time as indicated for disinfecting solutions.      Objective:   Physical Exam Vitals reviewed.  Constitutional:      Appearance: Normal appearance.  HENT:     Head: Normocephalic and atraumatic.  Eyes:     Extraocular Movements: Extraocular movements intact.     Conjunctiva/sclera: Conjunctivae normal.     Pupils: Pupils are equal, round, and reactive to light.  Skin:    General: Skin is warm and dry.     Coloration: Skin is not jaundiced.     Findings: No rash.  Neurological:     General: No focal deficit present.     Mental Status: She is alert and oriented to person, place, and time.  Psychiatric:        Mood and Affect: Mood normal.        Behavior: Behavior normal.        Thought Content: Thought content normal.          Assessment & Plan:   Lack of concentration- ongoing issue for pt.  Has had issues w/ anxiety and depression previously and ongoing so we discussed that Wellbutrin would be a great option to address all of these concerns.  She feels that this is a better option for her and is willing to start at a lower dose and titrate as needed.

## 2021-07-17 NOTE — Patient Instructions (Addendum)
Follow up in 4-6 weeks to recheck focus and mood START the Wellbutrin 1-2x/day Call with any questions or concerns You got this!!!

## 2021-07-21 ENCOUNTER — Other Ambulatory Visit: Payer: Self-pay | Admitting: Family Medicine

## 2021-07-21 DIAGNOSIS — J4530 Mild persistent asthma, uncomplicated: Secondary | ICD-10-CM

## 2021-07-26 ENCOUNTER — Other Ambulatory Visit: Payer: Self-pay | Admitting: Family Medicine

## 2021-07-26 DIAGNOSIS — F32A Depression, unspecified: Secondary | ICD-10-CM

## 2021-07-27 NOTE — Telephone Encounter (Signed)
UTD on visits, please escribe

## 2021-08-08 ENCOUNTER — Other Ambulatory Visit: Payer: Self-pay | Admitting: Family Medicine

## 2021-08-30 ENCOUNTER — Other Ambulatory Visit: Payer: Self-pay | Admitting: Family Medicine

## 2021-08-30 DIAGNOSIS — J4530 Mild persistent asthma, uncomplicated: Secondary | ICD-10-CM

## 2021-09-26 ENCOUNTER — Other Ambulatory Visit: Payer: Self-pay | Admitting: Family Medicine

## 2021-09-26 DIAGNOSIS — J302 Other seasonal allergic rhinitis: Secondary | ICD-10-CM

## 2021-10-09 ENCOUNTER — Other Ambulatory Visit: Payer: Self-pay | Admitting: Family Medicine

## 2021-10-09 ENCOUNTER — Encounter: Payer: Self-pay | Admitting: Family Medicine

## 2021-10-09 ENCOUNTER — Telehealth: Payer: BC Managed Care – PPO | Admitting: Family Medicine

## 2021-10-09 VITALS — Temp 99.5°F | Ht 64.0 in | Wt 154.0 lb

## 2021-10-09 DIAGNOSIS — J309 Allergic rhinitis, unspecified: Secondary | ICD-10-CM

## 2021-10-09 DIAGNOSIS — J4530 Mild persistent asthma, uncomplicated: Secondary | ICD-10-CM | POA: Diagnosis not present

## 2021-10-09 DIAGNOSIS — J329 Chronic sinusitis, unspecified: Secondary | ICD-10-CM

## 2021-10-09 MED ORDER — PREDNISONE 50 MG PO TABS: ORAL_TABLET | ORAL | 0 refills | Status: DC

## 2021-10-09 MED ORDER — AZITHROMYCIN 250 MG PO TABS: ORAL_TABLET | ORAL | 0 refills | Status: DC

## 2021-10-09 MED ORDER — AZELASTINE HCL 0.1 % NA SOLN: 2.0000 | Freq: Two times a day (BID) | NASAL | 12 refills | Status: DC

## 2021-10-09 NOTE — Progress Notes (Signed)
   Elaya Droege is a 45 y.o. female who presents today for a virtual office visit.  Assessment/Plan:  New/Acute Problems: Sinusitis Given that symptoms been present for last couple of weeks and are worsening will start antibiotics.  She has a penicillin allergy.  Start Z-Pak.  She has been on this in the past.  Also start prednisone burst and Astelin nasal spray.  Continue good oral hydration.  She can continue over-the-counter meds as needed.  Discussed reasons to return to care.  Chronic Problems Addressed Today: Asthma / Allergies Flare due to her URI above.  She will continue her home allergy meds.  We will add on Astelin and prednisone burst as above.     Subjective:  HPI:  Patient here with concern for sinus infection. Her 61 year old son had a URI about 2 weeks ago that turned into a sinus infection. Her symptoms started about 2 weeks ago. Symptoms including head cold and congestion. Also sore throat. Symptoms started worsening. Has had more facial pain and ear pain/pressure. Also with low grade fever. She has had some sneezing. Has a history of seasonal allergies and asthma that has flared up a little recently. She has had to use her inhaler a little more than normal. No wheezing or shortness of breath.        Objective/Observations  Physical Exam: Gen: NAD, resting comfortably Pulm: Normal work of breathing Neuro: Grossly normal, moves all extremities Psych: Normal affect and thought content  Virtual Visit via Video   I connected with Shavanna Furnari on 10/09/21 at  2:00 PM EST by a video enabled telemedicine application and verified that I am speaking with the correct person using two identifiers. The limitations of evaluation and management by telemedicine and the availability of in person appointments were discussed. The patient expressed understanding and agreed to proceed.   Patient location: Home Provider location: [place] participating  in the virtual visit: Myself and Patient     Algis Greenhouse. Jerline Pain, MD 10/09/2021 2:18 PM

## 2021-10-16 ENCOUNTER — Encounter: Payer: Self-pay | Admitting: Family Medicine

## 2021-11-05 ENCOUNTER — Other Ambulatory Visit: Payer: Self-pay | Admitting: Family Medicine

## 2021-11-05 DIAGNOSIS — J4530 Mild persistent asthma, uncomplicated: Secondary | ICD-10-CM

## 2021-12-13 DIAGNOSIS — Z6828 Body mass index (BMI) 28.0-28.9, adult: Secondary | ICD-10-CM | POA: Diagnosis not present

## 2021-12-13 DIAGNOSIS — Z1389 Encounter for screening for other disorder: Secondary | ICD-10-CM | POA: Diagnosis not present

## 2021-12-13 DIAGNOSIS — Z01419 Encounter for gynecological examination (general) (routine) without abnormal findings: Secondary | ICD-10-CM | POA: Diagnosis not present

## 2021-12-13 DIAGNOSIS — Z130 Encounter for screening for diseases of the blood and blood-forming organs and certain disorders involving the immune mechanism: Secondary | ICD-10-CM | POA: Diagnosis not present

## 2021-12-13 DIAGNOSIS — Z1231 Encounter for screening mammogram for malignant neoplasm of breast: Secondary | ICD-10-CM | POA: Diagnosis not present

## 2021-12-13 LAB — HM MAMMOGRAPHY

## 2021-12-14 ENCOUNTER — Other Ambulatory Visit: Payer: Self-pay | Admitting: Family Medicine

## 2021-12-14 DIAGNOSIS — F419 Anxiety disorder, unspecified: Secondary | ICD-10-CM

## 2021-12-14 DIAGNOSIS — F32A Depression, unspecified: Secondary | ICD-10-CM

## 2021-12-21 ENCOUNTER — Encounter: Payer: Self-pay | Admitting: Family Medicine

## 2021-12-21 ENCOUNTER — Other Ambulatory Visit: Payer: Self-pay | Admitting: Family Medicine

## 2021-12-21 DIAGNOSIS — J4530 Mild persistent asthma, uncomplicated: Secondary | ICD-10-CM

## 2021-12-26 ENCOUNTER — Other Ambulatory Visit: Payer: Self-pay | Admitting: Family Medicine

## 2021-12-26 DIAGNOSIS — J302 Other seasonal allergic rhinitis: Secondary | ICD-10-CM

## 2022-01-18 ENCOUNTER — Other Ambulatory Visit: Payer: Self-pay | Admitting: Family Medicine

## 2022-01-18 DIAGNOSIS — J4530 Mild persistent asthma, uncomplicated: Secondary | ICD-10-CM

## 2022-02-26 DIAGNOSIS — Z3046 Encounter for surveillance of implantable subdermal contraceptive: Secondary | ICD-10-CM | POA: Diagnosis not present

## 2022-04-25 ENCOUNTER — Other Ambulatory Visit: Payer: Self-pay | Admitting: Family Medicine

## 2022-04-25 ENCOUNTER — Other Ambulatory Visit: Payer: Self-pay

## 2022-04-25 DIAGNOSIS — F419 Anxiety disorder, unspecified: Secondary | ICD-10-CM

## 2022-04-25 MED ORDER — FLUOXETINE HCL 20 MG PO CAPS: 20.0000 mg | ORAL_CAPSULE | Freq: Every day | ORAL | 0 refills | Status: DC

## 2022-05-27 ENCOUNTER — Other Ambulatory Visit: Payer: Self-pay | Admitting: Family Medicine

## 2022-05-27 DIAGNOSIS — J4530 Mild persistent asthma, uncomplicated: Secondary | ICD-10-CM

## 2022-07-06 ENCOUNTER — Other Ambulatory Visit: Payer: Self-pay | Admitting: Family Medicine

## 2022-07-06 DIAGNOSIS — J4530 Mild persistent asthma, uncomplicated: Secondary | ICD-10-CM

## 2022-07-26 ENCOUNTER — Other Ambulatory Visit: Payer: Self-pay | Admitting: Family Medicine

## 2022-07-26 DIAGNOSIS — F419 Anxiety disorder, unspecified: Secondary | ICD-10-CM

## 2022-08-12 ENCOUNTER — Telehealth: Payer: BC Managed Care – PPO | Admitting: Physician Assistant

## 2022-08-12 DIAGNOSIS — J029 Acute pharyngitis, unspecified: Secondary | ICD-10-CM | POA: Diagnosis not present

## 2022-08-12 MED ORDER — AZITHROMYCIN 250 MG PO TABS: ORAL_TABLET | ORAL | 0 refills | Status: AC

## 2022-08-12 NOTE — Progress Notes (Signed)
Virtual Visit via Video Note   I, Inda Coke, connected with  Cassandra Gross  ([id number], 09-02-1976) on 08/12/22 at 11:15 AM EDT by a video-enabled telemedicine application and verified that I am speaking with the correct person using two identifiers.  Location: Patient: Home Provider: [place] office   I discussed the limitations of evaluation and management by telemedicine and the availability of in person appointments. The patient expressed understanding and agreed to proceed.    History of Present Illness: Cassandra Gross is a 46 y.o. who identifies as a female who was assigned female at birth, and is being seen today for sinus infection.  Symptoms started Wednesday with a really bad sore throat. Nasal congestion. No productive cough. Has tried decongestants and ibuprofen. Had coworker that was sick. Has not done a COVID test.  Having significant fatigue and throat pain. Having pressure behind her ears. Denies fevers, chills.  PCN allergy -- has never taken amox.  Denies concerns for pregnancy.  Problems:  Patient Active Problem List   Diagnosis Date Noted   Overweight (BMI 25.0-29.9) 01/23/2021   Vitamin D deficiency 01/23/2021   Sprain and strain of metacarpophalangeal (joint) of hand 12/30/2019   Genital herpes simplex 09/29/2018   Anxiety and depression 08/12/2014   Routine general medical examination at a health care facility 03/01/2014   Asthma, mild persistent 03/01/2014    Allergies:  Allergies  Allergen Reactions   Dog Epithelium Allergy Skin Test    Shellfish Allergy Other (See Comments)    Skin test    Penicillins Rash    infant   Medications:  Current Outpatient Medications:    azithromycin (ZITHROMAX) 250 MG tablet, Take 2 tablets on day 1, then 1 tablet daily on days 2 through 5, Disp: 6 tablet, Rfl: 0   ALPRAZolam (XANAX) 0.5 MG tablet, Take 1 tablet (0.5 mg total) by mouth at bedtime., Disp: 30 tablet, Rfl: 1   azelastine  (ASTELIN) 0.1 % nasal spray, Place 2 sprays into both nostrils 2 (two) times daily., Disp: 30 mL, Rfl: 12   budesonide-formoterol (SYMBICORT) 80-4.5 MCG/ACT inhaler, INHALE 2 PUFFS INTO THE LUNGS TWICE A DAY AS NEEDED, Disp: 10.2 each, Rfl: 0   buPROPion (WELLBUTRIN) 75 MG tablet, TAKE 1 TABLET BY MOUTH TWICE A DAY, Disp: 180 tablet, Rfl: 2   cetirizine (ZYRTEC) 10 MG tablet, TAKE 1 TABLET BY MOUTH EVERY DAY, Disp: 90 tablet, Rfl: 0   Etonogestrel (IMPLANON [employer]is), Inject into the skin., Disp: , Rfl:    FLUoxetine (PROZAC) 20 MG capsule, TAKE 1 CAPSULE BY MOUTH EVERY DAY, Disp: 90 capsule, Rfl: 0   predniSONE (DELTASONE) 50 MG tablet, Take 1 tablet daily for 5 days., Disp: 5 tablet, Rfl: 0  Observations/Objective: Patient is well-developed, well-nourished in no acute distress.  Resting comfortably  at home.  Head is normocephalic, atraumatic.  No labored breathing.  Speech is clear and coherent with logical content.  Patient is alert and oriented at baseline.    Assessment and Plan: 1. Sore throat No red flags on exam Ddx includes: strep, COVID, other viral URI, early sinusitis Will treat empirically for strep NSAIDs for pain/inflammation If any worsening/lack of improvement, needs in - office evaluation   Follow Up Instructions: I discussed the assessment and treatment plan with the patient. The patient was provided an opportunity to ask questions and all were answered. The patient agreed with the plan and demonstrated an understanding of the instructions.  A copy of instructions were sent to  the patient via MyChart unless otherwise noted below.   The patient was advised to call back or seek an in-person evaluation if the symptoms worsen or if the condition fails to improve as anticipated.  Inda Coke, [place]

## 2022-09-25 ENCOUNTER — Other Ambulatory Visit: Payer: Self-pay | Admitting: Family Medicine

## 2022-09-25 NOTE — Telephone Encounter (Signed)
Xanax 0.5 mg LOV: 07/17/21 Last Refill:04/25/22 Upcoming appt: none

## 2022-10-10 ENCOUNTER — Other Ambulatory Visit: Payer: Self-pay | Admitting: Family Medicine

## 2022-10-10 DIAGNOSIS — F32A Depression, unspecified: Secondary | ICD-10-CM

## 2022-11-09 ENCOUNTER — Other Ambulatory Visit: Payer: Self-pay | Admitting: Family Medicine

## 2022-11-09 DIAGNOSIS — J4530 Mild persistent asthma, uncomplicated: Secondary | ICD-10-CM

## 2022-11-12 ENCOUNTER — Ambulatory Visit: Payer: BC Managed Care – PPO | Admitting: Family Medicine

## 2022-11-12 ENCOUNTER — Encounter: Payer: Self-pay | Admitting: Family Medicine

## 2022-11-12 VITALS — BP 122/74 | HR 86 | Temp 97.6°F | Resp 16 | Ht 64.0 in | Wt 159.1 lb

## 2022-11-12 DIAGNOSIS — M79644 Pain in right finger(s): Secondary | ICD-10-CM

## 2022-11-12 DIAGNOSIS — Z23 Encounter for immunization: Secondary | ICD-10-CM

## 2022-11-12 MED ORDER — MELOXICAM 15 MG PO TABS: 15.0000 mg | ORAL_TABLET | Freq: Every day | ORAL | 0 refills | Status: DC

## 2022-11-12 NOTE — Patient Instructions (Signed)
Follow up via MyChart in 7-10 days START the Meloxicam once daily- take w/ food Ice as needed If things are better with the anti-inflammatory- yay!  We can stop there! If things are not better or worsening, we'll refer to a hand specialist Call with any questions or concerns Stay Safe!  Stay Healthy! Happy New Year!!!

## 2022-11-12 NOTE — Progress Notes (Signed)
   Subjective:    Patient ID: Cassandra Gross, female    DOB: [birth year], 47 y.o.   MRN: [id number]  HPI R hand pain- sxs started ~2 months ago w/ pain in middle finger.  Is having swelling.  Having achiness at base of palm.  No known injury.  Pt is R handed.  No bruising.  Finger stays swollen- does not come and go.  Described as 'more of an annoyance'.  No pain when using hands or doing things, more pain at rest.   Review of Systems For ROS see HPI     Objective:   Physical Exam Vitals reviewed.  Constitutional:      General: She is not in acute distress.    Appearance: Normal appearance. She is not ill-appearing.  HENT:     Head: Normocephalic and atraumatic.  Eyes:     Extraocular Movements: Extraocular movements intact.     Conjunctiva/sclera: Conjunctivae normal.     Pupils: Pupils are equal, round, and reactive to light.  Cardiovascular:     Pulses: Normal pulses.  Musculoskeletal:        General: Swelling (mild swelling of R middle finger) and tenderness (TTP over R middle MCP and PIP.  no TTP over DIP) present. No deformity.     Comments: No triggering of R middle finger but it does not flex and extend as smoothly as the others  Skin:    General: Skin is warm and dry.     Findings: No bruising, lesion or rash.  Neurological:     General: No focal deficit present.     Mental Status: She is alert and oriented to person, place, and time.  Psychiatric:        Mood and Affect: Mood normal.        Behavior: Behavior normal.        Thought Content: Thought content normal.           Assessment & Plan:  Finger pain- new.  R middle finger.  No known injury.  Enlarged compared to L middle finger.  Pt is R hand dominant.  Will start scheduled Meloxicam daily to see if sxs improve.  If not, will refer to hand specialist.  Pt expressed understanding and is in agreement w/ plan.

## 2022-12-19 DIAGNOSIS — Z1389 Encounter for screening for other disorder: Secondary | ICD-10-CM | POA: Diagnosis not present

## 2022-12-19 DIAGNOSIS — Z130 Encounter for screening for diseases of the blood and blood-forming organs and certain disorders involving the immune mechanism: Secondary | ICD-10-CM | POA: Diagnosis not present

## 2022-12-19 DIAGNOSIS — Z01419 Encounter for gynecological examination (general) (routine) without abnormal findings: Secondary | ICD-10-CM | POA: Diagnosis not present

## 2022-12-19 DIAGNOSIS — Z6826 Body mass index (BMI) 26.0-26.9, adult: Secondary | ICD-10-CM | POA: Diagnosis not present

## 2022-12-19 DIAGNOSIS — Z1231 Encounter for screening mammogram for malignant neoplasm of breast: Secondary | ICD-10-CM | POA: Diagnosis not present

## 2022-12-25 ENCOUNTER — Encounter: Payer: Self-pay | Admitting: Family Medicine

## 2023-01-08 ENCOUNTER — Ambulatory Visit (INDEPENDENT_AMBULATORY_CARE_PROVIDER_SITE_OTHER): Payer: BC Managed Care – PPO | Admitting: Family Medicine

## 2023-01-08 ENCOUNTER — Encounter: Payer: Self-pay | Admitting: Family Medicine

## 2023-01-08 VITALS — BP 122/78 | HR 98 | Temp 97.8°F | Resp 16 | Ht 64.0 in | Wt 152.5 lb

## 2023-01-08 DIAGNOSIS — E559 Vitamin D deficiency, unspecified: Secondary | ICD-10-CM

## 2023-01-08 DIAGNOSIS — D2261 Melanocytic nevi of right upper limb, including shoulder: Secondary | ICD-10-CM | POA: Diagnosis not present

## 2023-01-08 DIAGNOSIS — D485 Neoplasm of uncertain behavior of skin: Secondary | ICD-10-CM | POA: Diagnosis not present

## 2023-01-08 DIAGNOSIS — F419 Anxiety disorder, unspecified: Secondary | ICD-10-CM

## 2023-01-08 DIAGNOSIS — Z0000 Encounter for general adult medical examination without abnormal findings: Secondary | ICD-10-CM | POA: Diagnosis not present

## 2023-01-08 DIAGNOSIS — E663 Overweight: Secondary | ICD-10-CM

## 2023-01-08 DIAGNOSIS — D2272 Melanocytic nevi of left lower limb, including hip: Secondary | ICD-10-CM | POA: Diagnosis not present

## 2023-01-08 DIAGNOSIS — F32A Depression, unspecified: Secondary | ICD-10-CM

## 2023-01-08 DIAGNOSIS — D2361 Other benign neoplasm of skin of right upper limb, including shoulder: Secondary | ICD-10-CM | POA: Diagnosis not present

## 2023-01-08 DIAGNOSIS — J302 Other seasonal allergic rhinitis: Secondary | ICD-10-CM

## 2023-01-08 DIAGNOSIS — C44719 Basal cell carcinoma of skin of left lower limb, including hip: Secondary | ICD-10-CM | POA: Diagnosis not present

## 2023-01-08 DIAGNOSIS — C44612 Basal cell carcinoma of skin of right upper limb, including shoulder: Secondary | ICD-10-CM | POA: Diagnosis not present

## 2023-01-08 DIAGNOSIS — D692 Other nonthrombocytopenic purpura: Secondary | ICD-10-CM | POA: Diagnosis not present

## 2023-01-08 DIAGNOSIS — D2262 Melanocytic nevi of left upper limb, including shoulder: Secondary | ICD-10-CM | POA: Diagnosis not present

## 2023-01-08 LAB — HEPATIC FUNCTION PANEL
ALT: 44 U/L — ABNORMAL HIGH (ref 0–35)
AST: 29 U/L (ref 0–37)
Albumin: 4.5 g/dL (ref 3.5–5.2)
Alkaline Phosphatase: 69 U/L (ref 39–117)
Bilirubin, Direct: 0.1 mg/dL (ref 0.0–0.3)
Total Bilirubin: 0.6 mg/dL (ref 0.2–1.2)
Total Protein: 7.4 g/dL (ref 6.0–8.3)

## 2023-01-08 LAB — LIPID PANEL
Cholesterol: 207 mg/dL — ABNORMAL HIGH (ref 0–200)
HDL: 52.1 mg/dL (ref >39.00)
LDL Cholesterol: 135 mg/dL — ABNORMAL HIGH (ref 0–99)
NonHDL: 154.64
Total CHOL/HDL Ratio: 4
Triglycerides: 97 mg/dL (ref 0.0–149.0)
VLDL: 19.4 mg/dL (ref 0.0–40.0)

## 2023-01-08 LAB — CBC WITH DIFFERENTIAL/PLATELET
Basophils Absolute: 0 10*3/uL (ref 0.0–0.1)
Basophils Relative: 0.8 % (ref 0.0–3.0)
Eosinophils Absolute: 0.4 10*3/uL (ref 0.0–0.7)
Eosinophils Relative: 6.3 % — ABNORMAL HIGH (ref 0.0–5.0)
HCT: 48.3 % — ABNORMAL HIGH (ref 36.0–46.0)
Hemoglobin: 16.3 g/dL — ABNORMAL HIGH (ref 12.0–15.0)
Lymphocytes Relative: 27.6 % (ref 12.0–46.0)
Lymphs Abs: 1.7 10*3/uL (ref 0.7–4.0)
MCHC: 33.7 g/dL (ref 30.0–36.0)
MCV: 98.5 fl (ref 78.0–100.0)
Monocytes Absolute: 0.5 10*3/uL (ref 0.1–1.0)
Monocytes Relative: 8.3 % (ref 3.0–12.0)
Neutro Abs: 3.6 10*3/uL (ref 1.4–7.7)
Neutrophils Relative %: 57 % (ref 43.0–77.0)
Platelets: 313 10*3/uL (ref 150.0–400.0)
RBC: 4.9 Mil/uL (ref 3.87–5.11)
RDW: 13.8 % (ref 11.5–15.5)
WBC: 6.3 10*3/uL (ref 4.0–10.5)

## 2023-01-08 LAB — BASIC METABOLIC PANEL
BUN: 8 mg/dL (ref 6–23)
CO2: 27 mEq/L (ref 19–32)
Calcium: 9.7 mg/dL (ref 8.4–10.5)
Chloride: 98 mEq/L (ref 96–112)
Creatinine, Ser: 0.69 mg/dL (ref 0.40–1.20)
GFR: 105.7 mL/min (ref >60.00)
Glucose, Bld: 101 mg/dL — ABNORMAL HIGH (ref 70–99)
Potassium: 4.3 mEq/L (ref 3.5–5.1)
Sodium: 135 mEq/L (ref 135–145)

## 2023-01-08 LAB — TSH: TSH: 2.55 u[IU]/mL (ref 0.35–5.50)

## 2023-01-08 LAB — VITAMIN D 25 HYDROXY (VIT D DEFICIENCY, FRACTURES): VITD: 21.03 ng/mL — ABNORMAL LOW (ref 30.00–100.00)

## 2023-01-08 MED ORDER — CETIRIZINE HCL 10 MG PO TABS: 10.0000 mg | ORAL_TABLET | Freq: Every day | ORAL | 1 refills | Status: DC

## 2023-01-08 MED ORDER — ALPRAZOLAM 0.5 MG PO TABS: 0.5000 mg | ORAL_TABLET | Freq: Every day | ORAL | 1 refills | Status: DC

## 2023-01-08 MED ORDER — BUPROPION HCL 75 MG PO TABS: 75.0000 mg | ORAL_TABLET | Freq: Two times a day (BID) | ORAL | 2 refills | Status: AC

## 2023-01-08 MED ORDER — FLUOXETINE HCL 20 MG PO CAPS: 20.0000 mg | ORAL_CAPSULE | Freq: Every day | ORAL | 1 refills | Status: DC

## 2023-01-08 NOTE — Assessment & Plan Note (Signed)
Check labs and replete prn. 

## 2023-01-08 NOTE — Progress Notes (Signed)
   Subjective:    Patient ID: Cassandra Gross, female    DOB: 07-Nov-1975, 47 y.o.   MRN: [id number]  HPI CPE- UTD on pap, mammo, Tdap, flu.  No concerns today  Patient Care Team    Relationship Specialty Notifications Start End  Midge Minium, MD PCP - General Family Medicine  03/01/14   Cheri Fowler, MD Consulting Physician Obstetrics and Gynecology  03/22/15      Health Maintenance  Topic Date Due   PAP SMEAR-Modifier  05/26/2023   INFLUENZA VACCINE  Completed   HIV Screening  Completed   HPV VACCINES  Aged Out   DTaP/Tdap/Td  Discontinued   COVID-19 Vaccine  Discontinued   Hepatitis C Screening  Discontinued      Review of Systems Patient reports no vision/ hearing changes, adenopathy,fever, weight change,  persistant/recurrent hoarseness , swallowing issues, chest pain, palpitations, edema, persistant/recurrent cough, hemoptysis, dyspnea (rest/exertional/paroxysmal nocturnal), gastrointestinal bleeding (melena, rectal bleeding), abdominal pain, significant heartburn, bowel changes, GU symptoms (dysuria, hematuria, incontinence), Gyn symptoms (abnormal  bleeding, pain),  syncope, focal weakness, memory loss, numbness & tingling, skin/hair/nail changes, abnormal bruising or bleeding, anxiety, or depression.     Objective:   Physical Exam General Appearance:    Alert, cooperative, no distress, appears stated age  Head:    Normocephalic, without obvious abnormality, atraumatic  Eyes:    PERRL, conjunctiva/corneas clear, EOM's intact both eyes  Ears:    Normal TM's and external ear canals, both ears  Nose:   Nares normal, septum midline, mucosa normal, no drainage    or sinus tenderness  Throat:   Lips, mucosa, and tongue normal; teeth and gums normal  Neck:   Supple, symmetrical, trachea midline, no adenopathy;    Thyroid: no enlargement/tenderness/nodules  Back:     Symmetric, no curvature, ROM normal, no CVA tenderness  Lungs:     Clear to auscultation bilaterally,  respirations unlabored  Chest Wall:    No tenderness or deformity   Heart:    Regular rate and rhythm, S1 and S2 normal, no murmur, rub   or gallop  Breast Exam:    Deferred to GYN  Abdomen:     Soft, non-tender, bowel sounds active all four quadrants,    no masses, no organomegaly  Genitalia:    Deferred to GYN  Rectal:    Extremities:   Extremities normal, atraumatic, no cyanosis or edema  Pulses:   2+ and symmetric all extremities  Skin:   Skin color, texture, turgor normal, no rashes or lesions  Lymph nodes:   Cervical, supraclavicular, and axillary nodes normal  Neurologic:   CNII-XII intact, normal strength, sensation and reflexes    throughout          Assessment & Plan:

## 2023-01-08 NOTE — Assessment & Plan Note (Signed)
BMI stable.  Check labs to risk stratify.  Will follow.

## 2023-01-08 NOTE — Patient Instructions (Signed)
Follow up in 1 year or as needed We'll notify you of your lab results and make any changes if needed Keep up the good work on healthy diet and regular exercise- you look great! Call with any questions or concerns Stay Safe!  Stay Healthy!! 

## 2023-01-08 NOTE — Addendum Note (Signed)
Addended byPauline Good, Avik Leoni on: 01/08/2023 11:09 AM   Modules accepted: Orders

## 2023-01-08 NOTE — Assessment & Plan Note (Signed)
Pt's PE WNL.  UTD on pap, mammo, Tdap.  Check labs.  Anticipatory guidance provided.  

## 2023-01-08 NOTE — Addendum Note (Signed)
Addended by: Midge Minium on: 01/08/2023 11:14 AM   Modules accepted: Orders

## 2023-01-09 ENCOUNTER — Other Ambulatory Visit: Payer: Self-pay

## 2023-01-09 ENCOUNTER — Telehealth: Payer: Self-pay

## 2023-01-09 DIAGNOSIS — E559 Vitamin D deficiency, unspecified: Secondary | ICD-10-CM

## 2023-01-09 MED ORDER — VITAMIN D (ERGOCALCIFEROL) 1.25 MG (50000 UNIT) PO CAPS: 50000.0000 [IU] | ORAL_CAPSULE | ORAL | 12 refills | Status: DC

## 2023-01-09 NOTE — Telephone Encounter (Signed)
Informed pt of lab results. Vit D 50,000 units has been sent to pharmacy  

## 2023-01-09 NOTE — Telephone Encounter (Signed)
-----   Message from Midge Minium, MD sent at 01/09/2023  7:26 AM EDT ----- Total cholesterol and LDL (bad cholesterol) have both increased.  Thankfully the ratio of good to bad is still within range so no medication is needed.  Continue to work on healthy diet and regular exercise.  Liver functions (ALT) are stable  Hemoglobin shows that you are a little bit dehydrated.  Please continue to drink plenty of water  Vit D is low.  Based on this, we need to start 50,000 units weekly x12 weeks in addition to daily OTC supplement of at least 2000 units.

## 2023-01-18 ENCOUNTER — Encounter: Payer: Self-pay | Admitting: Family Medicine

## 2023-01-18 ENCOUNTER — Ambulatory Visit: Payer: BC Managed Care – PPO | Admitting: Family Medicine

## 2023-01-18 VITALS — BP 124/80 | HR 99 | Temp 98.6°F | Resp 16 | Ht 64.0 in | Wt 153.4 lb

## 2023-01-18 DIAGNOSIS — Z1152 Encounter for screening for COVID-19: Secondary | ICD-10-CM | POA: Diagnosis not present

## 2023-01-18 DIAGNOSIS — H66002 Acute suppurative otitis media without spontaneous rupture of ear drum, left ear: Secondary | ICD-10-CM

## 2023-01-18 DIAGNOSIS — J069 Acute upper respiratory infection, unspecified: Secondary | ICD-10-CM

## 2023-01-18 DIAGNOSIS — R6889 Other general symptoms and signs: Secondary | ICD-10-CM | POA: Diagnosis not present

## 2023-01-18 LAB — POCT INFLUENZA A/B
Influenza A, POC: NEGATIVE
Influenza B, POC: NEGATIVE

## 2023-01-18 LAB — POC COVID19 BINAXNOW: SARS Coronavirus 2 Ag: NEGATIVE

## 2023-01-18 MED ORDER — ALBUTEROL SULFATE HFA 108 (90 BASE) MCG/ACT IN AERS: 2.0000 | INHALATION_SPRAY | Freq: Four times a day (QID) | RESPIRATORY_TRACT | 0 refills | Status: DC | PRN

## 2023-01-18 MED ORDER — AZITHROMYCIN 250 MG PO TABS: ORAL_TABLET | ORAL | 0 refills | Status: DC

## 2023-01-18 NOTE — Patient Instructions (Signed)
Follow up as needed or as scheduled START the Zpack as directed- 2 today and then 1 daily USE the Albuterol inhaler as needed Drink LOTS of water REST!!! Mucinex DM for cough and congestion Continue your daily allergy medication Call with any questions or concerns Hang in there!!!

## 2023-01-18 NOTE — Progress Notes (Signed)
   Subjective:    Patient ID: Cassandra Gross, female    DOB: 1976/06/27, 47 y.o.   MRN: [id number]  HPI Cough- sxs started Wednesday.  She lost her voice, developed fatigue, cough, chest congestion.  No fever.  No body aches.  + HA.  Taking ibuprofen for HA's, did neb tx yesterday w/ some improvement.  Having some SOB and wheezing.  Taking Zyrtec, using Symbicort.  Needs albuterol inhaler.  + frontal sinus pressure.  Bilateral ear fullness.   Review of Systems For ROS see HPI     Objective:   Physical Exam Vitals reviewed.  Constitutional:      General: She is not in acute distress.    Appearance: She is ill-appearing.  HENT:     Head: Normocephalic and atraumatic.     Right Ear: Tympanic membrane and ear canal normal.     Left Ear: Ear canal normal. A middle ear effusion is present. Tympanic membrane is bulging.     Nose: Congestion present. No rhinorrhea.     Comments: Mild TTP over frontal sinuses, no TTP over maxillary sinuses Eyes:     Extraocular Movements: Extraocular movements intact.     Conjunctiva/sclera: Conjunctivae normal.  Pulmonary:     Effort: Pulmonary effort is normal. No respiratory distress.     Breath sounds: Normal breath sounds. No wheezing or rhonchi.  Musculoskeletal:     Cervical back: Neck supple.  Lymphadenopathy:     Cervical: No cervical adenopathy.  Skin:    General: Skin is warm and dry.  Neurological:     General: No focal deficit present.     Mental Status: She is alert and oriented to person, place, and time.  Psychiatric:        Mood and Affect: Mood normal.        Behavior: Behavior normal.        Thought Content: Thought content normal.           Assessment & Plan:   L OM- new.  Start Zpack as pt is allergic to Amox.  Reviewed supportive care and red flags that should prompt return.  Pt expressed understanding and is in agreement w/ plan.   URI- new.  COVID and flu negative.  On Zpack for OM.  Reviewed supportive care and red  flags that should prompt return.  Pt expressed understanding and is in agreement w/ plan.

## 2023-01-30 DIAGNOSIS — C44719 Basal cell carcinoma of skin of left lower limb, including hip: Secondary | ICD-10-CM | POA: Diagnosis not present

## 2023-03-10 ENCOUNTER — Other Ambulatory Visit: Payer: Self-pay | Admitting: Family Medicine

## 2023-03-11 ENCOUNTER — Other Ambulatory Visit: Payer: Self-pay

## 2023-03-11 MED ORDER — ALBUTEROL SULFATE HFA 108 (90 BASE) MCG/ACT IN AERS: 2.0000 | INHALATION_SPRAY | Freq: Four times a day (QID) | RESPIRATORY_TRACT | 0 refills | Status: DC | PRN

## 2023-04-12 ENCOUNTER — Other Ambulatory Visit: Payer: Self-pay | Admitting: Family Medicine

## 2023-05-15 ENCOUNTER — Other Ambulatory Visit: Payer: Self-pay | Admitting: Family Medicine

## 2023-08-07 ENCOUNTER — Telehealth: Payer: Self-pay | Admitting: Family Medicine

## 2023-08-07 ENCOUNTER — Ambulatory Visit: Payer: BC Managed Care – PPO | Admitting: Family Medicine

## 2023-08-13 ENCOUNTER — Ambulatory Visit: Payer: BC Managed Care – PPO | Admitting: Family Medicine

## 2023-08-13 ENCOUNTER — Encounter: Payer: Self-pay | Admitting: Family Medicine

## 2023-08-13 ENCOUNTER — Other Ambulatory Visit: Payer: Self-pay | Admitting: Family Medicine

## 2023-08-13 VITALS — BP 118/72 | HR 87 | Temp 97.7°F | Ht 64.0 in | Wt 155.0 lb

## 2023-08-13 DIAGNOSIS — R200 Anesthesia of skin: Secondary | ICD-10-CM

## 2023-08-13 DIAGNOSIS — J302 Other seasonal allergic rhinitis: Secondary | ICD-10-CM

## 2023-08-13 DIAGNOSIS — R202 Paresthesia of skin: Secondary | ICD-10-CM

## 2023-08-13 DIAGNOSIS — F419 Anxiety disorder, unspecified: Secondary | ICD-10-CM

## 2023-08-13 DIAGNOSIS — Z23 Encounter for immunization: Secondary | ICD-10-CM

## 2023-08-13 DIAGNOSIS — F32A Depression, unspecified: Secondary | ICD-10-CM

## 2023-08-13 MED ORDER — MELOXICAM 15 MG PO TABS: 15.0000 mg | ORAL_TABLET | Freq: Every day | ORAL | 0 refills | Status: AC

## 2023-08-13 NOTE — Patient Instructions (Signed)
Follow up as needed or as scheduled Start the meloxicam once daily- take w/ food Consider wearing a wrist splint when you sleep at night We'll call you to schedule your hand appt [facility]!!! Call with any questions or concerns Stay Safe!  Stay Healthy! Hang in there!!!

## 2023-08-13 NOTE — Progress Notes (Signed)
   Subjective:    Patient ID: Cassandra Gross, female    DOB: 1976/07/17, 47 y.o.   MRN: [id number]  HPI Numbness in R hand- sxs started ~1 yr ago.  Episodic.  Pt reports R hand will 'go to sleep randomly'.  Hand will go numb when arm is bent.  Has increased sensitivity of forearm and has marked sensitivity over wrist/carpal tunnel.  All fingers are impacted.  Sxs are becoming more frequent.   Review of Systems For ROS see HPI     Objective:   Physical Exam Vitals reviewed.  Constitutional:      General: She is not in acute distress.    Appearance: Normal appearance. She is not ill-appearing.  HENT:     Head: Normocephalic and atraumatic.  Cardiovascular:     Pulses: Normal pulses.  Musculoskeletal:        General: Tenderness (TTP over R ulnar groove and R ventral wrist) present.  Skin:    General: Skin is warm and dry.  Neurological:     Mental Status: She is alert and oriented to person, place, and time.     Cranial Nerves: No cranial nerve deficit.     Motor: No weakness.     Deep Tendon Reflexes: Reflexes normal.     Comments: (-) phalen's test  Psychiatric:        Mood and Affect: Mood normal.        Behavior: Behavior normal.        Thought Content: Thought content normal.           Assessment & Plan:  R hand numbness/tingling- new.  Sxs started 1 year ago but have been worsening (becoming more frequent).  Seems to be more notable when elbow is bent.  Will start scheduled Meloxicam and refer to hand for complete evaluation of possible carpal tunnel or ulnar nerve irritation.  Pt expressed understanding and is in agreement w/ plan.

## 2023-09-04 ENCOUNTER — Other Ambulatory Visit: Payer: Self-pay | Admitting: Medical Genetics

## 2023-09-04 DIAGNOSIS — Z006 Encounter for examination for normal comparison and control in clinical research program: Secondary | ICD-10-CM

## 2023-09-17 ENCOUNTER — Other Ambulatory Visit: Payer: Self-pay

## 2023-09-17 ENCOUNTER — Ambulatory Visit: Payer: BC Managed Care – PPO | Admitting: Family Medicine

## 2023-09-17 VITALS — BP 108/74 | HR 87 | Temp 97.9°F | Ht 64.0 in | Wt 158.1 lb

## 2023-09-17 DIAGNOSIS — J329 Chronic sinusitis, unspecified: Secondary | ICD-10-CM | POA: Diagnosis not present

## 2023-09-17 DIAGNOSIS — R0981 Nasal congestion: Secondary | ICD-10-CM

## 2023-09-17 DIAGNOSIS — J4530 Mild persistent asthma, uncomplicated: Secondary | ICD-10-CM | POA: Diagnosis not present

## 2023-09-17 DIAGNOSIS — B9689 Other specified bacterial agents as the cause of diseases classified elsewhere: Secondary | ICD-10-CM | POA: Diagnosis not present

## 2023-09-17 LAB — POC COVID19 BINAXNOW: SARS Coronavirus 2 Ag: NEGATIVE

## 2023-09-17 MED ORDER — ALBUTEROL SULFATE HFA 108 (90 BASE) MCG/ACT IN AERS: 2.0000 | INHALATION_SPRAY | RESPIRATORY_TRACT | 1 refills | Status: DC | PRN

## 2023-09-17 MED ORDER — DOXYCYCLINE HYCLATE 100 MG PO TABS: 100.0000 mg | ORAL_TABLET | Freq: Two times a day (BID) | ORAL | 0 refills | Status: DC

## 2023-09-17 MED ORDER — BUDESONIDE-FORMOTEROL FUMARATE 80-4.5 MCG/ACT IN AERO: INHALATION_SPRAY | RESPIRATORY_TRACT | 0 refills | Status: DC

## 2023-09-17 NOTE — Patient Instructions (Signed)
Follow up as needed or as scheduled START the Doxycycline twice daily- take w/ food Drink LOTS of fluids REST! Call with any questions or concerns Hang in there!!! Happy Holidays!!!

## 2023-09-17 NOTE — Progress Notes (Signed)
   Subjective:    Patient ID: Cassandra Gross, female    DOB: [birth year], 47 y.o.   MRN: [id number]  HPI URI- sxs started Friday.  Ears are popping.  L ear pain, L frontal sinus pain.  No tooth pain.  No fever, body aches.  Minimal cough.  + congestion.  + sore throat.   Review of Systems For ROS see HPI     Objective:   Physical Exam Vitals reviewed.  Constitutional:      General: She is not in acute distress.    Appearance: Normal appearance. She is well-developed.  HENT:     Head: Normocephalic and atraumatic.     Right Ear: Tympanic membrane normal.     Left Ear: A middle ear effusion is present.     Nose: Mucosal edema present. No rhinorrhea.     Right Sinus: No maxillary sinus tenderness or frontal sinus tenderness.     Left Sinus: Maxillary sinus tenderness and frontal sinus tenderness present.     Mouth/Throat:     Pharynx: Uvula midline. Posterior oropharyngeal erythema present. No oropharyngeal exudate.  Eyes:     Conjunctiva/sclera: Conjunctivae normal.     Pupils: Pupils are equal, round, and reactive to light.  Cardiovascular:     Rate and Rhythm: Normal rate and regular rhythm.     Heart sounds: Normal heart sounds.  Pulmonary:     Effort: Pulmonary effort is normal. No respiratory distress.     Breath sounds: Normal breath sounds. No wheezing.  Musculoskeletal:     Cervical back: Normal range of motion and neck supple.  Lymphadenopathy:     Cervical: No cervical adenopathy.  Skin:    General: Skin is warm and dry.  Neurological:     General: No focal deficit present.     Mental Status: She is alert and oriented to person, place, and time.     Cranial Nerves: No cranial nerve deficit.     Motor: No weakness.     Coordination: Coordination normal.  Psychiatric:        Mood and Affect: Mood normal.        Behavior: Behavior normal.        Thought Content: Thought content normal.           Assessment & Plan:  Bacterial sinusitis- pt's sxs and PE  consistent w/ infxn.  Given PCN allergy will start Doxycycline.  Reviewed supportive care and red flags that should prompt return.  Pt expressed understanding and is in agreement w/ plan.

## 2023-09-18 ENCOUNTER — Ambulatory Visit: Payer: BC Managed Care – PPO | Admitting: Orthopedic Surgery

## 2023-09-18 ENCOUNTER — Other Ambulatory Visit (INDEPENDENT_AMBULATORY_CARE_PROVIDER_SITE_OTHER): Payer: Self-pay

## 2023-09-18 DIAGNOSIS — G5601 Carpal tunnel syndrome, right upper limb: Secondary | ICD-10-CM | POA: Diagnosis not present

## 2023-09-18 DIAGNOSIS — M25531 Pain in right wrist: Secondary | ICD-10-CM | POA: Diagnosis not present

## 2023-09-18 NOTE — Progress Notes (Signed)
Cassandra Gross - 47 y.o. female MRN [id number]  Date of birth: May 18, 1976  Office Visit Note: Visit Date: 09/18/2023 PCP: Sheliah Hatch, MD Referred by: Sheliah Hatch, MD  Subjective: No chief complaint on file.  HPI: Cassandra Gross is a pleasant 47 y.o. female who presents today for evaluation of ongoing numbness and tingling of the right hand radial and ulnar sided digits.  She states that the symptoms have been present for over a year now.  She has trialed over-the-counter medication nonsteroidal anti-inflammatories with mild relief.  No prior bracing, no prior therapy.  She does desk work primarily.  She is right-hand dominant.  Overall healthy and active at baseline.  Pertinent ROS were reviewed with the patient and found to be negative unless otherwise specified above in HPI.   Visit Reason: right hand/wrist Duration of symptoms: 1+ year Hand dominance: right Occupation: Non Profit Diabetic: No Smoking: No Heart/Lung History: asthma Blood Thinners:  none  Prior Testing/EMG: none Injections (Date):none Treatments: meloxicam- no relief Prior Surgery: none  Assessment & Plan: Visit Diagnoses:  1. Acute carpal tunnel syndrome of right wrist     Plan: Extensive discussion was had the patient today regarding her clinical examination.  Clinically, she is demonstrating signs and symptoms consistent with right carpal tunnel syndrome and possible cubital tunnel syndrome.  I described the etiology and pathophysiology of these nerve compression disorders as well as all treatment modalities ranging from conservative to surgical.  From a conservative standpoint, we discussed activity modification, bracing, medication, injections and therapy.  From a surgical standpoint, we discussed the possible need for nerve decompression in the future should her symptoms remain refractory conservative care.  Given that she has not trialed conservative care to date, we will begin with  bracing of the right wrist and we will set her up with occupational therapy for right carpal tunnel and cubital tunnel syndrome.  I will also like her to obtain right upper extremity electrodiagnostic study in order to better classify severity for potential treatment in the future.  She will return to me in approximately 6 weeks she reviewed the results of her electrodiagnostic study, at that time we can also evaluate how well the conservative treatments are working for her.  She expressed full understanding.  Follow-up: No follow-ups on file.   Meds & Orders: No orders of the defined types were placed in this encounter.   Orders Placed This Encounter  Procedures   XR Wrist Complete Right   Ambulatory referral to Physical Medicine Rehab     Procedures: No procedures performed      Clinical History: No specialty comments available.  She reports that she has never smoked. She has never used smokeless tobacco. No results for input(s): "HGBA1C", "LABURIC" in the last 8760 hours.  Objective:   Vital Signs: There were no vitals taken for this visit.  Physical Exam  Gen: Well-appearing, in no acute distress; non-toxic CV: Regular Rate. Well-perfused. Warm.  Resp: Breathing unlabored on room air; no wheezing. Psych: Fluid speech in conversation; appropriate affect; normal thought process  Ortho Exam PHYSICAL EXAM:  General: Patient is well appearing and in no distress. Cervical spine mobility is full in all directions:  Skin and Muscle: No significant skin changes are apparent to upper extremities.   Range of Motion and Palpation Tests: Mobility is full about the elbows with flexion and extension.  No significant evidence of nerve subluxation at bilateral elbow.  Forearm supination and pronation are 85/85 bilaterally.  Wrist flexion/extension is 75/65 bilaterally.  Digital flexion and extension are full.  Thumb opposition is full to the base of the small fingers bilaterally.    No  cords or nodules are palpated.  No triggering is observed.     Neurologic, Vascular, Motor: Sensation is diminished to light touch in the right median and ulnar nerve distributions.  2-point discrimination in these regions is between 7 and 8 mm.  Tinel's testing positive right cubital tunnel and carpal tunnel Phalen's positive right, Derkan's compression positive right Thenar atrophy: Negative bilaterally APB: 5/5 bilaterally  Fingers pink and well perfused.  Capillary refill is brisk.     Lab Results  Component Value Date   HGBA1C 5.8 08/12/2019     Imaging: XR Wrist Complete Right  Result Date: 09/18/2023 X-rays of the right wrist were completed today, multiple views X-rays demonstrate stable appearance of the radiocarpal and midcarpal articulations without significant bony abnormalities appreciated.  Bone mineralization is appropriate.   Past Medical/Family/Surgical/Social History: Medications & Allergies reviewed per EMR, new medications updated. Patient Active Problem List   Diagnosis Date Noted   Overweight (BMI 25.0-29.9) 01/23/2021   Vitamin D deficiency 01/23/2021   Sprain and strain of metacarpophalangeal (joint) of hand 12/30/2019   Genital herpes simplex 09/29/2018   Anxiety and depression 08/12/2014   Routine general medical examination at a health care facility 03/01/2014   Asthma, mild persistent 03/01/2014   Past Medical History:  Diagnosis Date   Anxiety    Asthma    HSV (herpes simplex virus) anogenital infection    Family History  Problem Relation Age of Onset   Asthma Mother    Hyperlipidemia Father    Hypertension Father    Asthma Maternal Grandfather    Past Surgical History:  Procedure Laterality Date   WISDOM TOOTH EXTRACTION     Social History   Occupational History   Not on file  Tobacco Use   Smoking status: Never   Smokeless tobacco: Never  Vaping Use   Vaping status: Never Used  Substance and Sexual Activity   Alcohol use:  No   Drug use: No   Sexual activity: Yes    Erinn Mendosa Trevor Mace, M.D. [facility] 9:08 AM

## 2023-09-18 NOTE — Addendum Note (Signed)
Addended by: Ellis Savage on: 09/18/2023 09:12 AM   Modules accepted: Orders

## 2023-09-23 ENCOUNTER — Encounter: Payer: BC Managed Care – PPO | Admitting: Rehabilitative and Restorative Service Providers"

## 2023-09-23 NOTE — Therapy (Signed)
OUTPATIENT OCCUPATIONAL THERAPY ORTHO EVALUATION  Patient Name: Cassandra Gross MRN: [id number] DOB:03/18/1976, 47 y.o., female Today's Date: 09/24/2023  PCP: Neena Rhymes, MD REFERRING PROVIDER:  Samuella Cota, MD    END OF SESSION:  OT End of Session - 09/24/23 1155     Visit Number 1    Number of Visits 6    Date for OT Re-Evaluation 11/08/23    Authorization Type [employer]    OT Start Time 1155    OT Stop Time 1235    OT Time Calculation (min) 40 min    Activity Tolerance Patient tolerated treatment well;No increased pain    Behavior During Therapy WFL for tasks assessed/performed             Past Medical History:  Diagnosis Date   Anxiety    Asthma    HSV (herpes simplex virus) anogenital infection    Past Surgical History:  Procedure Laterality Date   WISDOM TOOTH EXTRACTION     Patient Active Problem List   Diagnosis Date Noted   Overweight (BMI 25.0-29.9) 01/23/2021   Vitamin D deficiency 01/23/2021   Sprain and strain of metacarpophalangeal (joint) of hand 12/30/2019   Genital herpes simplex 09/29/2018   Anxiety and depression 08/12/2014   Routine general medical examination at a health care facility 03/01/2014   Asthma, mild persistent 03/01/2014    ONSET DATE: approx 1 year onset (chronic)   REFERRING DIAG: G56.01 (ICD-10-CM) - Acute carpal tunnel syndrome of right wrist   THERAPY DIAG:  Paresthesia of skin - Plan: Ot plan of care cert/re-cert  Stiffness of joint, forearm, right - Plan: Ot plan of care cert/re-cert  Rationale for Evaluation and Treatment: Rehabilitation  SUBJECTIVE:   SUBJECTIVE STATEMENT: She states nerve numbness and tingling in Rt hand over past year. Worse at night, wakes her up multiple times. She does a lot of desk work, event planning, etc.  "More annoying" than anything else.     PERTINENT HISTORY: Per MD notes: "ongoing numbness and tingling of the right hand radial and ulnar sided digits. She states that  the symptoms have been present for over a year now. She has trialed over-the-counter medication nonsteroidal anti-inflammatories with mild relief. No prior bracing, no prior therapy. She does desk work primarily. She is right-hand dominant. Overall healthy and active at baseline. "   PRECAUTIONS: Other: adhesive sensitivity   RED FLAGS: None   WEIGHT BEARING RESTRICTIONS: No  PAIN:  Are you having pain? No pain or numbness at start of eval.   FALLS: Has patient fallen in last 6 months? No  LIVING ENVIRONMENT: Lives with: lives with their family Has following equipment at home: None  PLOF: Independent  PATIENT GOALS: To improve symptoms of paresthesia in right dominant hand without surgery  NEXT MD VISIT: As needed   OBJECTIVE: (All objective assessments below are from initial evaluation on: 09/24/23 unless otherwise specified.)   HAND DOMINANCE: Right   ADLs: Overall ADLs: States no significant decreased ability with basic tasks but some numbness and problems sleeping, also feeling clumsy at times with fine motor skills.   FUNCTIONAL OUTCOME MEASURES: Eval: Quick DASH 20% impairment today  (Higher % Score  =  More Impairment)     UPPER EXTREMITY ROM     Shoulder to Wrist AROM Right eval Left eval  Forearm supination 72 82  Forearm pronation  85 85  Wrist flexion 70 70  Wrist extension 70 68  (Blank rows = not tested)   Hand AROM  Right eval Left eval  Full Fist Ability (or Gap to Distal Palmar Crease) yes yes  Thumb Opposition  (Kapandji Scale)  10 10  (Blank rows = not tested)   UPPER EXTREMITY MMT:     MMT Right 09/24/23 Left 09/24/23  Elbow flexion    Elbow extension    Forearm supination    Forearm pronation 4+/5 tender 5/5  thumb flexion 5/5 5/5  Small finger abduction 5/5 5/5  (Blank rows = not tested)  HAND FUNCTION: Eval: No observed weakness in affected Rt hand.  Grip strength Right: 47 lbs, Left: 38 lbs   COORDINATION: Eval: No  observed coordination impairments with affected Rt hand.  SENSATION: Eval:  Light touch intact today, though complaints about periodic numbness in Rt hand, worse at night and work  EDEMA:   Eval: none  COGNITION: Eval: Overall cognitive status: WFL for evaluation today   OBSERVATIONS:   Eval: provacative nerve testing today: neg scratch collapse at ulnar nerve sites, positive bil at carpal tunnel and pronators R > L.  10sec positive Phalen's test on Rt (neg on Lt)   Presents as mild-moderate median nerve compression at pronator and carpal tunnel bil R>L (Lt is asymptomatic)    TODAY'S TREATMENT:  Post-evaluation treatment:   She is given edu on self-care and sleep positions to avoid to prevent nerve compression at neck, shoulder, elbow, FA, wrist. She is given neuromuscular education for nerve gliding as well as neuropraxia and nerve regeneration.  She is also given the following edu for home exercises to be done 3-4x day. They were explained and demo'd and she did well with them.  Leaves stating understanding, no pain or numbness.        Exercises - Forearm Supination Stretch  - 3-4 x daily - 3-5 reps - 15 sec hold - Wrist Prayer Stretch  - 4 x daily - 3-5 reps - 15 sec hold - Median Nerve Flossing  - 2-3 x daily - 5-10 reps - Seated Median Nerve Glide  - 3-4 x daily - 5 reps   PATIENT EDUCATION: Education details: See tx section above for details  Person educated: Patient Education method: Verbal Instruction, Teach back, Handouts  Education comprehension: States and demonstrates understanding, Additional Education required    HOME EXERCISE PROGRAM: Access Code: [email or web address] URL: [email or web address] Date: 09/24/2023 Prepared by: Fannie Knee   GOALS: Goals reviewed with patient? Yes   SHORT TERM GOALS: (STG required if POC>30 days) Target Date: 10/04/23  Pt will obtain protective, custom orthotic. Goal status: TBD/PRN  2.  Pt will demo/state  understanding of initial HEP to improve pain levels and prerequisite motion. Goal status: INITIAL   LONG TERM GOALS: Target Date: 11/08/23  Pt will improve functional ability by decreased impairment per Quick DASH assessment from 20% to 10% or better, for better quality of life. Goal status: INITIAL  2.  Pt will improve A/ROM in Rt FA supination from 72 to at least 82, to have functional motion for tasks like reach and grasp.  Goal status: INITIAL  3.  Pt will improve strength in FA pronation from tneder 4+/5 MMT to at least 5/5 MMT to have increased functional ability to carry out selfcare and higher-level homecare tasks with less difficulty. Goal status: INITIAL  4.  Pt will decrease numbness in past week from "moderate" to none significant or better to have better sleep and occupational participation in daily roles. Goal status: INITIAL  ASSESSMENT:  CLINICAL IMPRESSION: Patient is a 47 y.o.  female who was seen today for occupational therapy evaluation for paresthesia in the right dominant hand in the median nerve distribution.  This presents as mild to moderate carpal tunnel with double crush phenomenon at pronator.  She also has asymptomatic nerve compression in the left arm median nerve.  She will benefit from outpatient occupational therapy to decrease symptoms and increase quality of life and likely avoid surgery.   PERFORMANCE DEFICITS: in functional skills including ADLs, IADLs, sensation, ROM, strength, fascial restrictions, muscle spasms, body mechanics, decreased knowledge of precautions, and UE functional use, cognitive skills including problem solving and safety awareness, and psychosocial skills including coping strategies, environmental adaptation, and habits.   IMPAIRMENTS: are limiting patient from ADLs, IADLs, rest and sleep, work, leisure, and social participation.   COMORBIDITIES: may have co-morbidities  that affects occupational performance. Patient will benefit  from skilled OT to address above impairments and improve overall function.  MODIFICATION OR ASSISTANCE TO COMPLETE EVALUATION: No modification of tasks or assist necessary to complete an evaluation.  OT OCCUPATIONAL PROFILE AND HISTORY: Problem focused assessment: Including review of records relating to presenting problem.  CLINICAL DECISION MAKING: LOW - limited treatment options, no task modification necessary  REHAB POTENTIAL: Excellent  EVALUATION COMPLEXITY: Low      PLAN:  OT FREQUENCY: 1-2x/week  OT DURATION: 6 weeks through 11/08/2023 and up to 6 total visits as needed  PLANNED INTERVENTIONS: 97168 OT Re-evaluation, 97535 self care/ADL training, 65784 therapeutic exercise, 97530 therapeutic activity, 97112 neuromuscular re-education, 97140 manual therapy, 97010 moist heat, 97010 cryotherapy, 97034 contrast bath, 97760 Orthotics management and training, 69629 Splinting (initial encounter), [id number] Subsequent splinting/medication, compression bandaging, Dry needling, energy conservation, coping strategies training, and patient/family education  RECOMMENDED OTHER SERVICES: None now  CONSULTED AND AGREED WITH PLAN OF CARE: Patient  PLAN FOR NEXT SESSION:   Review precautions, avoidance of nerve compression, and home exercises.  Consider adding bicep stretching as "fridge door" stretch   Fannie Knee, OTR/L, CHT 09/24/2023, 3:25 PM

## 2023-09-24 ENCOUNTER — Ambulatory Visit: Payer: BC Managed Care – PPO | Admitting: Physical Medicine and Rehabilitation

## 2023-09-24 ENCOUNTER — Ambulatory Visit: Payer: BC Managed Care – PPO | Admitting: Rehabilitative and Restorative Service Providers"

## 2023-09-24 ENCOUNTER — Encounter: Payer: Self-pay | Admitting: Rehabilitative and Restorative Service Providers"

## 2023-09-24 ENCOUNTER — Other Ambulatory Visit: Payer: Self-pay

## 2023-09-24 DIAGNOSIS — M25631 Stiffness of right wrist, not elsewhere classified: Secondary | ICD-10-CM

## 2023-09-24 DIAGNOSIS — R202 Paresthesia of skin: Secondary | ICD-10-CM

## 2023-09-24 NOTE — Progress Notes (Signed)
Functional Pain Scale - descriptive words and definitions  Mild (2)   Noticeable when not distracted/no impact on ADL's/sleep only slightly affected and able to   use both passive and active distraction for comfort. Mild range order  Average Pain 2  RUE NCS-- She has tingling, numbness, and aching in hands and wrist. She feels some tightness in fingers when making a fist.

## 2023-09-25 ENCOUNTER — Other Ambulatory Visit (HOSPITAL_COMMUNITY)
Admission: RE | Admit: 2023-09-25 | Discharge: 2023-09-25 | Disposition: A | Discharge status: Home / Self Care | Payer: Self-pay | Source: Ambulatory Visit | Attending: Oncology | Admitting: Oncology

## 2023-09-25 DIAGNOSIS — Z006 Encounter for examination for normal comparison and control in clinical research program: Secondary | ICD-10-CM | POA: Insufficient documentation

## 2023-09-25 NOTE — Procedures (Signed)
EMG & NCV Findings: Evaluation of the right median motor nerve showed prolonged distal onset latency (4.4 ms).  The right median (across palm) sensory nerve showed no response (Palm) and prolonged distal peak latency (4.3 ms).  All remaining nerves (as indicated in the following tables) were within normal limits.    All examined muscles (as indicated in the following table) showed no evidence of electrical instability.    Impression: The above electrodiagnostic study is ABNORMAL and reveals evidence of a mild to moderate right median nerve entrapment at the wrist (carpal tunnel syndrome) affecting sensory and motor components. There is no significant electrodiagnostic evidence of any other focal nerve entrapment, brachial plexopathy or cervical radiculopathy.   Recommendations: 1.  Follow-up with referring physician. 2.  Continue current management of symptoms.  May want to consider diagnostic injection. 3.  Continue use of resting splint at night-time and as needed during the day.  ___________________________ Elease Hashimoto Board Certified, American Board of Physical Medicine and Rehabilitation    Nerve Conduction Studies Anti Sensory Summary Table   Stim Site NR Peak (ms) Norm Peak (ms) P-T Amp (V) Norm P-T Amp Site1 Site2 Delta-P (ms) Dist (cm) Vel (m/s) Norm Vel (m/s)  Right Median Acr Palm Anti Sensory (2nd Digit)  30.7C  Wrist    *4.3 <3.6 24.9 >10 Wrist Palm  0.0    Palm *NR  <2.0          Right Radial Anti Sensory (Base 1st Digit)  30.9C  Wrist    2.0 <3.1 26.7  Wrist Base 1st Digit 2.0 0.0    Right Ulnar Anti Sensory (5th Digit)  31.2C  Wrist    2.9 <3.7 29.5 >15.0 Wrist 5th Digit 2.9 14.0 48 >38   Motor Summary Table   Stim Site NR Onset (ms) Norm Onset (ms) O-P Amp (mV) Norm O-P Amp Site1 Site2 Delta-0 (ms) Dist (cm) Vel (m/s) Norm Vel (m/s)  Right Median Motor (Abd Poll Brev)  31.2C  Wrist    *4.4 <4.2 5.8 >5 Elbow Wrist 3.4 17.0 50 >50  Elbow    7.8  5.7          Right Ulnar Motor (Abd Dig Min)  31.4C  Wrist    2.5 <4.2 11.3 >3 B Elbow Wrist 2.3 15.5 67 >53  B Elbow    4.8  11.1  A Elbow B Elbow 1.0 10.0 100 >53  A Elbow    5.8  11.1          EMG   Side Muscle Nerve Root Ins Act Fibs Psw Amp Dur Poly Recrt Int Dennie Bible Comment  Right Abd Poll Brev Median C8-T1 Nml Nml Nml Nml Nml 0 Nml Nml   Right 1stDorInt Ulnar C8-T1 Nml Nml Nml Nml Nml 0 Nml Nml   Right PronatorTeres Median C6-7 Nml Nml Nml Nml Nml 0 Nml Nml   Right Biceps Musculocut C5-6 Nml Nml Nml Nml Nml 0 Nml Nml   Right Deltoid Axillary C5-6 Nml Nml Nml Nml Nml 0 Nml Nml     Nerve Conduction Studies Anti Sensory Left/Right Comparison   Stim Site L Lat (ms) R Lat (ms) L-R Lat (ms) L Amp (V) R Amp (V) L-R Amp (%) Site1 Site2 L Vel (m/s) R Vel (m/s) L-R Vel (m/s)  Median Acr Palm Anti Sensory (2nd Digit)  30.7C  Wrist  *4.3   24.9  Wrist [employer]  Radial Anti Sensory (Base 1st Digit)  30.9C  Wrist  2.0   26.7  Wrist Base 1st Digit     Ulnar Anti Sensory (5th Digit)  31.2C  Wrist  2.9   29.5  Wrist 5th Digit  48    Motor Left/Right Comparison   Stim Site L Lat (ms) R Lat (ms) L-R Lat (ms) L Amp (mV) R Amp (mV) L-R Amp (%) Site1 Site2 L Vel (m/s) R Vel (m/s) L-R Vel (m/s)  Median Motor (Abd Poll Brev)  31.2C  Wrist  *4.4   5.8  Elbow Wrist  50   Elbow  7.8   5.7        Ulnar Motor (Abd Dig Min)  31.4C  Wrist  2.5   11.3  B Elbow Wrist  67   B Elbow  4.8   11.1  A Elbow B Elbow  100   A Elbow  5.8   11.1           Waveforms:

## 2023-09-28 NOTE — Progress Notes (Signed)
Cassandra Gross - 47 y.o. female MRN [id number]  Date of birth: 07-02-1976  Office Visit Note: Visit Date: 09/24/2023 PCP: Sheliah Hatch, MD Referred by: Samuella Cota, MD  Subjective: Chief Complaint  Patient presents with   Right Hand - Numbness, Tingling   HPI:  Cassandra Gross is a 47 y.o. female who comes in today at the request of Dr. Bonner Puna for evaluation and management of chronic, worsening and severe pain, numbness and tingling in the Right upper extremities.  Patient is Right hand dominant.  She reports about 1 year of worsening right handed paresthesias and numbness somewhat globally somewhat more nerve radial side of the hand.  She gets symptoms at night but also during the day.  Has tried all manner of conservative care otherwise.  No prior electrodiagnostic study.    ROS Otherwise per HPI.  Assessment & Plan: Visit Diagnoses:    ICD-10-CM   1. Paresthesia of skin  R20.2 NCV with EMG (electromyography)      Plan: Impression: The above electrodiagnostic study is ABNORMAL and reveals evidence of a mild to moderate right median nerve entrapment at the wrist (carpal tunnel syndrome) affecting sensory and motor components. There is no significant electrodiagnostic evidence of any other focal nerve entrapment, brachial plexopathy or cervical radiculopathy.   Recommendations: 1.  Follow-up with referring physician. 2.  Continue current management of symptoms.  May want to consider diagnostic injection. 3.  Continue use of resting splint at night-time and as needed during the day.  Meds & Orders: No orders of the defined types were placed in this encounter.   Orders Placed This Encounter  Procedures   NCV with EMG (electromyography)    Follow-up: Return for Anshul Argarwala, MD.   Procedures: No procedures performed  EMG & NCV Findings: Evaluation of the right median motor nerve showed prolonged distal onset latency (4.4 ms).  The right median  (across palm) sensory nerve showed no response (Palm) and prolonged distal peak latency (4.3 ms).  All remaining nerves (as indicated in the following tables) were within normal limits.    All examined muscles (as indicated in the following table) showed no evidence of electrical instability.    Impression: The above electrodiagnostic study is ABNORMAL and reveals evidence of a mild to moderate right median nerve entrapment at the wrist (carpal tunnel syndrome) affecting sensory and motor components. There is no significant electrodiagnostic evidence of any other focal nerve entrapment, brachial plexopathy or cervical radiculopathy.   Recommendations: 1.  Follow-up with referring physician. 2.  Continue current management of symptoms.  May want to consider diagnostic injection. 3.  Continue use of resting splint at night-time and as needed during the day.  ___________________________ Elease Hashimoto Board Certified, American Board of Physical Medicine and Rehabilitation    Nerve Conduction Studies Anti Sensory Summary Table   Stim Site NR Peak (ms) Norm Peak (ms) P-T Amp (V) Norm P-T Amp Site1 Site2 Delta-P (ms) Dist (cm) Vel (m/s) Norm Vel (m/s)  Right Median Acr Palm Anti Sensory (2nd Digit)  30.7C  Wrist    *4.3 <3.6 24.9 >10 Wrist Palm  0.0    Palm *NR  <2.0          Right Radial Anti Sensory (Base 1st Digit)  30.9C  Wrist    2.0 <3.1 26.7  Wrist Base 1st Digit 2.0 0.0    Right Ulnar Anti Sensory (5th Digit)  31.2C  Wrist    2.9 <3.7 29.5 >15.0 Wrist 5th  Digit 2.9 14.0 48 >38   Motor Summary Table   Stim Site NR Onset (ms) Norm Onset (ms) O-P Amp (mV) Norm O-P Amp Site1 Site2 Delta-0 (ms) Dist (cm) Vel (m/s) Norm Vel (m/s)  Right Median Motor (Abd Poll Brev)  31.2C  Wrist    *4.4 <4.2 5.8 >5 Elbow Wrist 3.4 17.0 50 >50  Elbow    7.8  5.7         Right Ulnar Motor (Abd Dig Min)  31.4C  Wrist    2.5 <4.2 11.3 >3 B Elbow Wrist 2.3 15.5 67 >53  B Elbow    4.8  11.1  A  Elbow B Elbow 1.0 10.0 100 >53  A Elbow    5.8  11.1          EMG   Side Muscle Nerve Root Ins Act Fibs Psw Amp Dur Poly Recrt Int Dennie Bible Comment  Right Abd Poll Brev Median C8-T1 Nml Nml Nml Nml Nml 0 Nml Nml   Right 1stDorInt Ulnar C8-T1 Nml Nml Nml Nml Nml 0 Nml Nml   Right PronatorTeres Median C6-7 Nml Nml Nml Nml Nml 0 Nml Nml   Right Biceps Musculocut C5-6 Nml Nml Nml Nml Nml 0 Nml Nml   Right Deltoid Axillary C5-6 Nml Nml Nml Nml Nml 0 Nml Nml     Nerve Conduction Studies Anti Sensory Left/Right Comparison   Stim Site L Lat (ms) R Lat (ms) L-R Lat (ms) L Amp (V) R Amp (V) L-R Amp (%) Site1 Site2 L Vel (m/s) R Vel (m/s) L-R Vel (m/s)  Median Acr Palm Anti Sensory (2nd Digit)  30.7C  Wrist  *4.3   24.9  Wrist Palm     Palm             Radial Anti Sensory (Base 1st Digit)  30.9C  Wrist  2.0   26.7  Wrist Base 1st Digit     Ulnar Anti Sensory (5th Digit)  31.2C  Wrist  2.9   29.5  Wrist 5th Digit  48    Motor Left/Right Comparison   Stim Site L Lat (ms) R Lat (ms) L-R Lat (ms) L Amp (mV) R Amp (mV) L-R Amp (%) Site1 Site2 L Vel (m/s) R Vel (m/s) L-R Vel (m/s)  Median Motor (Abd Poll Brev)  31.2C  Wrist  *4.4   5.8  Elbow Wrist  50   Elbow  7.8   5.7        Ulnar Motor (Abd Dig Min)  31.4C  Wrist  2.5   11.3  B Elbow Wrist  67   B Elbow  4.8   11.1  A Elbow B Elbow  100   A Elbow  5.8   11.1           Waveforms:             Clinical History: No specialty comments available.     Objective:  VS:  HT:    WT:   BMI:     BP:   HR: bpm  TEMP: ( )  RESP:  Physical Exam Musculoskeletal:        General: No swelling, tenderness or deformity.     Comments: Inspection reveals no atrophy of the bilateral APB or FDI or hand intrinsics. There is no swelling, color changes, allodynia or dystrophic changes. There is 5 out of 5 strength in the bilateral wrist extension, finger abduction and long finger flexion. There is intact sensation to light  touch in all dermatomal  and peripheral nerve distributions.  There is a positive Tinel's test at the right wrist.  There is a positive Phalen's test on the right. There is a negative Hoffmann's test bilaterally.  Skin:    General: Skin is warm and dry.     Findings: No erythema or rash.  Neurological:     General: No focal deficit present.     Mental Status: She is alert and oriented to person, place, and time.     Motor: No weakness or abnormal muscle tone.     Coordination: Coordination normal.  Psychiatric:        Mood and Affect: Mood normal.        Behavior: Behavior normal.      Imaging: No results found.

## 2023-10-08 LAB — GENECONNECT MOLECULAR SCREEN: Genetic Analysis Overall Interpretation: NEGATIVE

## 2023-10-24 ENCOUNTER — Encounter: Payer: Self-pay | Admitting: Family Medicine

## 2023-10-24 DIAGNOSIS — J4530 Mild persistent asthma, uncomplicated: Secondary | ICD-10-CM

## 2023-10-24 DIAGNOSIS — F419 Anxiety disorder, unspecified: Secondary | ICD-10-CM

## 2023-10-24 MED ORDER — ALPRAZOLAM 0.5 MG PO TABS: 0.5000 mg | ORAL_TABLET | Freq: Every day | ORAL | 1 refills | Status: DC

## 2023-10-24 MED ORDER — BUDESONIDE-FORMOTEROL FUMARATE 80-4.5 MCG/ACT IN AERO: INHALATION_SPRAY | RESPIRATORY_TRACT | 1 refills | Status: DC

## 2023-10-24 NOTE — Telephone Encounter (Signed)
Chart reviewed in Dr. Rennis Golden absence.  Office visit November 19, for acute sinusitis.  Has been on Symbicort previously.  Will refill Symbicort, but if that is not improving her symptoms fairly quickly and lessening need for albuterol, recommend office visit.  Please call her with this plan.  Controlled substance database reviewed.  Alprazolam No. 30 last filled on 01/08/2023, previously 09/10/2022.  Physical in March with Dr. Beverely Low.  Refill ordered.

## 2023-10-24 NOTE — Addendum Note (Signed)
Addended by: Meredith Staggers R on: 10/24/2023 05:34 PM   Modules accepted: Orders

## 2023-10-25 NOTE — Telephone Encounter (Signed)
error 

## 2023-11-07 DIAGNOSIS — R1032 Left lower quadrant pain: Secondary | ICD-10-CM | POA: Diagnosis not present

## 2023-11-08 DIAGNOSIS — R1032 Left lower quadrant pain: Secondary | ICD-10-CM | POA: Diagnosis not present

## 2024-01-07 DIAGNOSIS — Z1231 Encounter for screening mammogram for malignant neoplasm of breast: Secondary | ICD-10-CM | POA: Diagnosis not present

## 2024-01-07 DIAGNOSIS — Z01419 Encounter for gynecological examination (general) (routine) without abnormal findings: Secondary | ICD-10-CM | POA: Diagnosis not present

## 2024-01-13 ENCOUNTER — Ambulatory Visit (INDEPENDENT_AMBULATORY_CARE_PROVIDER_SITE_OTHER): Payer: BC Managed Care – PPO | Admitting: Family Medicine

## 2024-01-13 ENCOUNTER — Encounter: Payer: Self-pay | Admitting: Family Medicine

## 2024-01-13 VITALS — BP 116/68 | HR 84 | Temp 97.8°F | Ht 64.0 in | Wt 152.5 lb

## 2024-01-13 DIAGNOSIS — Z0000 Encounter for general adult medical examination without abnormal findings: Secondary | ICD-10-CM

## 2024-01-13 DIAGNOSIS — Z1159 Encounter for screening for other viral diseases: Secondary | ICD-10-CM

## 2024-01-13 DIAGNOSIS — E559 Vitamin D deficiency, unspecified: Secondary | ICD-10-CM

## 2024-01-13 DIAGNOSIS — Z1211 Encounter for screening for malignant neoplasm of colon: Secondary | ICD-10-CM

## 2024-01-13 DIAGNOSIS — E663 Overweight: Secondary | ICD-10-CM

## 2024-01-13 LAB — CBC WITH DIFFERENTIAL/PLATELET
Basophils Absolute: 0.1 10*3/uL (ref 0.0–0.1)
Basophils Relative: 1 % (ref 0.0–3.0)
Eosinophils Absolute: 0.4 10*3/uL (ref 0.0–0.7)
Eosinophils Relative: 7.2 % — ABNORMAL HIGH (ref 0.0–5.0)
HCT: 44.2 % (ref 36.0–46.0)
Hemoglobin: 14.8 g/dL (ref 12.0–15.0)
Lymphocytes Relative: 33.2 % (ref 12.0–46.0)
Lymphs Abs: 1.8 10*3/uL (ref 0.7–4.0)
MCHC: 33.5 g/dL (ref 30.0–36.0)
MCV: 99.1 fl (ref 78.0–100.0)
Monocytes Absolute: 0.4 10*3/uL (ref 0.1–1.0)
Monocytes Relative: 7.1 % (ref 3.0–12.0)
Neutro Abs: 2.8 10*3/uL (ref 1.4–7.7)
Neutrophils Relative %: 51.5 % (ref 43.0–77.0)
Platelets: 265 10*3/uL (ref 150.0–400.0)
RBC: 4.46 Mil/uL (ref 3.87–5.11)
RDW: 13.9 % (ref 11.5–15.5)
WBC: 5.5 10*3/uL (ref 4.0–10.5)

## 2024-01-13 LAB — LIPID PANEL
Cholesterol: 184 mg/dL (ref 0–200)
HDL: 48.2 mg/dL (ref >39.00)
LDL Cholesterol: 119 mg/dL — ABNORMAL HIGH (ref 0–99)
NonHDL: 136.24
Total CHOL/HDL Ratio: 4
Triglycerides: 84 mg/dL (ref 0.0–149.0)
VLDL: 16.8 mg/dL (ref 0.0–40.0)

## 2024-01-13 LAB — BASIC METABOLIC PANEL
BUN: 9 mg/dL (ref 6–23)
CO2: 27 meq/L (ref 19–32)
Calcium: 8.9 mg/dL (ref 8.4–10.5)
Chloride: 104 meq/L (ref 96–112)
Creatinine, Ser: 0.61 mg/dL (ref 0.40–1.20)
GFR: 108.12 mL/min (ref >60.00)
Glucose, Bld: 100 mg/dL — ABNORMAL HIGH (ref 70–99)
Potassium: 4.3 meq/L (ref 3.5–5.1)
Sodium: 139 meq/L (ref 135–145)

## 2024-01-13 LAB — HEPATIC FUNCTION PANEL
ALT: 26 U/L (ref 0–35)
AST: 20 U/L (ref 0–37)
Albumin: 4.4 g/dL (ref 3.5–5.2)
Alkaline Phosphatase: 52 U/L (ref 39–117)
Bilirubin, Direct: 0.1 mg/dL (ref 0.0–0.3)
Total Bilirubin: 0.5 mg/dL (ref 0.2–1.2)
Total Protein: 6.7 g/dL (ref 6.0–8.3)

## 2024-01-13 LAB — TSH: TSH: 2.97 u[IU]/mL (ref 0.35–5.50)

## 2024-01-13 LAB — VITAMIN D 25 HYDROXY (VIT D DEFICIENCY, FRACTURES): VITD: 17.71 ng/mL — ABNORMAL LOW (ref 30.00–100.00)

## 2024-01-13 NOTE — Progress Notes (Signed)
   Subjective:    Patient ID: Cassandra Gross, female    DOB: 29-Jun-1976, 48 y.o.   MRN: [id number]  HPI CPE- UTD on pap, mammo, Tdap.  Due for colon cancer screen.  Patient Care Team    Relationship Specialty Notifications Start End  Sheliah Hatch, MD PCP - General Family Medicine  03/01/14   Lavina Hamman, MD Consulting Physician Obstetrics and Gynecology  03/22/15      Health Maintenance  Topic Date Due   Pneumococcal Vaccine 44-61 Years old (1 of 2 - PCV) Never done   Hepatitis C Screening  Never done   COVID-19 Vaccine (1 - 2024-25 season) Never done   Colonoscopy  Never done   Cervical Cancer Screening (HPV/Pap Cotest)  05/26/2025   DTaP/Tdap/Td (3 - Td or Tdap) 11/12/2032   INFLUENZA VACCINE  Completed   HIV Screening  Completed   HPV VACCINES  Aged Out      Review of Systems Patient reports no vision/ hearing changes, adenopathy,fever, weight change,  persistant/recurrent hoarseness , swallowing issues, chest pain, palpitations, edema, persistant/recurrent cough, hemoptysis, dyspnea (rest/exertional/paroxysmal nocturnal), gastrointestinal bleeding (melena, rectal bleeding), abdominal pain, significant heartburn, bowel changes, GU symptoms (dysuria, hematuria, incontinence), Gyn symptoms (abnormal  bleeding, pain),  syncope, focal weakness, memory loss, numbness & tingling, skin/hair/nail changes, abnormal bruising or bleeding, anxiety, or depression.     Objective:   Physical Exam General Appearance:    Alert, cooperative, no distress, appears stated age  Head:    Normocephalic, without obvious abnormality, atraumatic  Eyes:    PERRL, conjunctiva/corneas clear, EOM's intact both eyes  Ears:    Normal TM's and external ear canals, both ears  Nose:   Nares normal, septum midline, mucosa normal, no drainage    or sinus tenderness  Throat:   Lips, mucosa, and tongue normal; teeth and gums normal  Neck:   Supple, symmetrical, trachea midline, no adenopathy;    Thyroid:  no enlargement/tenderness/nodules  Back:     Symmetric, no curvature, ROM normal, no CVA tenderness  Lungs:     Clear to auscultation bilaterally, respirations unlabored  Chest Wall:    No tenderness or deformity   Heart:    Regular rate and rhythm, S1 and S2 normal, no murmur, rub   or gallop  Breast Exam:    Deferred to GYN  Abdomen:     Soft, non-tender, bowel sounds active all four quadrants,    no masses, no organomegaly  Genitalia:    Deferred to GYN  Rectal:    Extremities:   Extremities normal, atraumatic, no cyanosis or edema  Pulses:   2+ and symmetric all extremities  Skin:   Skin color, texture, turgor normal, no rashes or lesions  Lymph nodes:   Cervical, supraclavicular, and axillary nodes normal  Neurologic:   CNII-XII intact, normal strength, sensation and reflexes    throughout          Assessment & Plan:

## 2024-01-13 NOTE — Patient Instructions (Signed)
 Follow up in 1 year or as needed We'll notify you of your lab results and make any changes if needed Keep up the good work on healthy diet and regular exercise- you're doing great!!! Complete and return the Cologuard as directed Call with any questions or concerns Stay Safe!  Stay Healthy! Happy Spring!!!

## 2024-01-13 NOTE — Assessment & Plan Note (Signed)
 Pt's PE WNL.  UTD on pap, mammo.  Will do Cologuard- order entered.  Check labs.  Anticipatory guidance provided.

## 2024-01-14 ENCOUNTER — Other Ambulatory Visit: Payer: Self-pay

## 2024-01-14 ENCOUNTER — Encounter: Payer: Self-pay | Admitting: Family Medicine

## 2024-01-14 DIAGNOSIS — E559 Vitamin D deficiency, unspecified: Secondary | ICD-10-CM

## 2024-01-14 LAB — HEPATITIS C ANTIBODY: Hepatitis C Ab: NONREACTIVE

## 2024-01-14 MED ORDER — VITAMIN D (ERGOCALCIFEROL) 1.25 MG (50000 UNIT) PO CAPS
50000.0000 [IU] | ORAL_CAPSULE | ORAL | 0 refills | Status: AC
Start: 2024-01-14 — End: ?

## 2024-01-20 ENCOUNTER — Other Ambulatory Visit: Payer: Self-pay | Admitting: Family Medicine

## 2024-01-20 DIAGNOSIS — J4530 Mild persistent asthma, uncomplicated: Secondary | ICD-10-CM

## 2024-04-01 ENCOUNTER — Encounter: Payer: Self-pay | Admitting: Family Medicine

## 2024-04-01 DIAGNOSIS — Z1211 Encounter for screening for malignant neoplasm of colon: Secondary | ICD-10-CM | POA: Diagnosis not present

## 2024-04-11 ENCOUNTER — Ambulatory Visit: Payer: Self-pay | Admitting: Family Medicine

## 2024-04-13 NOTE — Progress Notes (Signed)
 Pt has been notified.

## 2024-05-19 ENCOUNTER — Other Ambulatory Visit: Payer: Self-pay | Admitting: Family Medicine

## 2024-05-19 DIAGNOSIS — F419 Anxiety disorder, unspecified: Secondary | ICD-10-CM

## 2024-05-29 ENCOUNTER — Other Ambulatory Visit: Payer: Self-pay | Admitting: Family Medicine

## 2024-05-29 DIAGNOSIS — F32A Depression, unspecified: Secondary | ICD-10-CM

## 2024-06-01 NOTE — Telephone Encounter (Signed)
 Requested Prescriptions   Pending Prescriptions Disp Refills   ALPRAZolam  (XANAX ) 0.5 MG tablet [Pharmacy Med Name: ALPRAZOLAM  0.5 MG TABLET] 30 tablet     Sig: TAKE 1 TABLET BY MOUTH AT BEDTIME.     Date of patient request: 06/01/24 Last office visit: 01/13/24 Upcoming visit: 01/13/25 Date of last refill: 10/24/23 Last refill amount: 30 days

## 2024-08-31 ENCOUNTER — Encounter: Payer: Self-pay | Admitting: Radiology

## 2025-01-13 ENCOUNTER — Encounter: Admitting: Family Medicine
